# Patient Record
Sex: Female | Born: 1985 | Race: Asian | Hispanic: No | Marital: Single | Smoking: Never smoker
Health system: Southern US, Community
[De-identification: ages and names within clinical notes are randomized; demographics above are authoritative.]

## PROBLEM LIST (undated history)

## (undated) DIAGNOSIS — J45909 Unspecified asthma, uncomplicated: Secondary | ICD-10-CM

## (undated) DIAGNOSIS — O0930 Supervision of pregnancy with insufficient antenatal care, unspecified trimester: Secondary | ICD-10-CM

## (undated) DIAGNOSIS — Z789 Other specified health status: Secondary | ICD-10-CM

## (undated) HISTORY — PX: NO PAST SURGERIES: SHX2092

## (undated) HISTORY — DX: Unspecified asthma, uncomplicated: J45.909

## (undated) HISTORY — DX: Supervision of pregnancy with insufficient antenatal care, unspecified trimester: O09.30

---

## 2009-03-04 ENCOUNTER — Inpatient Hospital Stay (HOSPITAL_COMMUNITY): Admission: AD | Admit: 2009-03-04 | Discharge: 2009-03-06 | Payer: Self-pay | Admitting: Obstetrics

## 2010-08-10 NOTE — L&D Delivery Note (Signed)
Delivery Note At 5:55 AM a viable female was delivered via Vaginal, Spontaneous Delivery (Presentation:OA ; LOT ).  APGAR:9 9, ; weight 7#2 (3250g) .   Placenta status: delivered , .  Cord:  with the following complications: none.    Anesthesia:  epidural Episiotomy: none Lacerations: 2nd degree perineal, R sided labial Suture Repair: 3.0 vicryl rapide Est. Blood Loss (mL): 300  Mom to postpartum.  Baby to nursery-stable  O+, IUD, Bottle.  BOVARD,Renesha Lizama 05/08/2011, 6:11 AM

## 2010-10-28 ENCOUNTER — Inpatient Hospital Stay (HOSPITAL_COMMUNITY): Payer: Medicaid Other

## 2010-10-28 ENCOUNTER — Inpatient Hospital Stay (HOSPITAL_COMMUNITY)
Admission: AD | Admit: 2010-10-28 | Discharge: 2010-10-29 | Disposition: A | Discharge status: Home / Self Care | Payer: Medicaid Other | Source: Ambulatory Visit | Attending: Obstetrics & Gynecology | Admitting: Obstetrics & Gynecology

## 2010-10-28 DIAGNOSIS — O2390 Unspecified genitourinary tract infection in pregnancy, unspecified trimester: Secondary | ICD-10-CM | POA: Insufficient documentation

## 2010-10-28 DIAGNOSIS — R42 Dizziness and giddiness: Secondary | ICD-10-CM | POA: Insufficient documentation

## 2010-10-28 DIAGNOSIS — N390 Urinary tract infection, site not specified: Secondary | ICD-10-CM

## 2010-10-28 LAB — URINALYSIS, ROUTINE W REFLEX MICROSCOPIC
Bilirubin Urine: NEGATIVE
Glucose, UA: NEGATIVE mg/dL
Ketones, ur: 15 mg/dL — AB
pH: 6.5 (ref 5.0–8.0)

## 2010-10-28 LAB — CBC
HCT: 35 % — ABNORMAL LOW (ref 36.0–46.0)
Platelets: 201 10*3/uL (ref 150–400)
RDW: 13.2 % (ref 11.5–15.5)
WBC: 9.9 10*3/uL (ref 4.0–10.5)

## 2010-10-28 LAB — COMPREHENSIVE METABOLIC PANEL
ALT: 13 U/L (ref 0–35)
AST: 20 U/L (ref 0–37)
Albumin: 3.5 g/dL (ref 3.5–5.2)
Alkaline Phosphatase: 36 U/L — ABNORMAL LOW (ref 39–117)
GFR calc Af Amer: >60 mL/min (ref >60)
Glucose, Bld: 78 mg/dL (ref 70–99)
Potassium: 3.5 mEq/L (ref 3.5–5.1)
Sodium: 134 mEq/L — ABNORMAL LOW (ref 135–145)
Total Protein: 6.5 g/dL (ref 6.0–8.3)

## 2010-10-28 LAB — URINE MICROSCOPIC-ADD ON

## 2010-10-29 LAB — URINE CULTURE
Colony Count: NO GROWTH
Culture: NO GROWTH

## 2010-11-16 LAB — CBC
HCT: 29.7 % — ABNORMAL LOW (ref 36.0–46.0)
Hemoglobin: 12.7 g/dL (ref 12.0–15.0)
MCV: 94.2 fL (ref 78.0–100.0)
RBC: 3.16 MIL/uL — ABNORMAL LOW (ref 3.87–5.11)
RBC: 3.93 MIL/uL (ref 3.87–5.11)
WBC: 14.3 10*3/uL — ABNORMAL HIGH (ref 4.0–10.5)
WBC: 15.2 10*3/uL — ABNORMAL HIGH (ref 4.0–10.5)

## 2010-11-16 LAB — RPR: RPR Ser Ql: NONREACTIVE

## 2011-01-20 LAB — ABO/RH

## 2011-01-20 LAB — ANTIBODY SCREEN: Antibody Screen: NEGATIVE

## 2011-01-20 LAB — GC/CHLAMYDIA PROBE AMP, GENITAL: Gonorrhea: NEGATIVE

## 2011-01-20 LAB — HIV ANTIBODY (ROUTINE TESTING W REFLEX): HIV: NONREACTIVE

## 2011-04-07 LAB — STREP B DNA PROBE: GBS: NEGATIVE

## 2011-05-07 ENCOUNTER — Inpatient Hospital Stay (HOSPITAL_COMMUNITY)
Admission: AD | Admit: 2011-05-07 | Discharge: 2011-05-09 | DRG: 775 | Disposition: A | Discharge status: Home / Self Care | Payer: Medicaid Other | Source: Ambulatory Visit | Attending: Obstetrics and Gynecology | Admitting: Obstetrics and Gynecology

## 2011-05-07 DIAGNOSIS — Z3490 Encounter for supervision of normal pregnancy, unspecified, unspecified trimester: Secondary | ICD-10-CM

## 2011-05-07 HISTORY — DX: Other specified health status: Z78.9

## 2011-05-08 ENCOUNTER — Encounter (HOSPITAL_COMMUNITY): Payer: Self-pay | Admitting: *Deleted

## 2011-05-08 ENCOUNTER — Encounter (HOSPITAL_COMMUNITY): Payer: Self-pay | Admitting: Anesthesiology

## 2011-05-08 ENCOUNTER — Inpatient Hospital Stay (HOSPITAL_COMMUNITY): Payer: Medicaid Other | Admitting: Anesthesiology

## 2011-05-08 DIAGNOSIS — Z3490 Encounter for supervision of normal pregnancy, unspecified, unspecified trimester: Secondary | ICD-10-CM

## 2011-05-08 LAB — COMPREHENSIVE METABOLIC PANEL
Albumin: 2.7 g/dL — ABNORMAL LOW (ref 3.5–5.2)
Alkaline Phosphatase: 178 U/L — ABNORMAL HIGH (ref 39–117)
BUN: 13 mg/dL (ref 6–23)
Chloride: 104 mEq/L (ref 96–112)
Creatinine, Ser: 0.6 mg/dL (ref 0.50–1.10)
GFR calc Af Amer: >60 mL/min (ref >60)
Glucose, Bld: 92 mg/dL (ref 70–99)
Potassium: 3.6 mEq/L (ref 3.5–5.1)
Total Bilirubin: 0.2 mg/dL — ABNORMAL LOW (ref 0.3–1.2)
Total Protein: 6.2 g/dL (ref 6.0–8.3)

## 2011-05-08 LAB — CBC
HCT: 34 % — ABNORMAL LOW (ref 36.0–46.0)
Hemoglobin: 11.2 g/dL — ABNORMAL LOW (ref 12.0–15.0)
MCHC: 32.9 g/dL (ref 30.0–36.0)
MCV: 89.2 fL (ref 78.0–100.0)
RDW: 13.4 % (ref 11.5–15.5)

## 2011-05-08 LAB — ABO/RH: ABO/RH(D): O POS

## 2011-05-08 LAB — RPR: RPR Ser Ql: NONREACTIVE

## 2011-05-08 MED ORDER — DIBUCAINE 1 % RE OINT: 1.0000 "application " | TOPICAL_OINTMENT | RECTAL | Status: DC | PRN

## 2011-05-08 MED ORDER — PRENATAL PLUS 27-1 MG PO TABS: 1.0000 | ORAL_TABLET | Freq: Every day | ORAL | Status: DC

## 2011-05-08 MED ORDER — LIDOCAINE HCL 1.5 % IJ SOLN
INTRAMUSCULAR | Status: DC | PRN
  Administered 2011-05-08 (×2): 4 mL via EPIDURAL
  Administered 2011-05-08: 2 mL via EPIDURAL

## 2011-05-08 MED ORDER — IBUPROFEN 600 MG PO TABS
600.0000 mg | ORAL_TABLET | Freq: Four times a day (QID) | ORAL | Status: DC
  Administered 2011-05-08 – 2011-05-09 (×4): 600 mg via ORAL
  Filled 2011-05-08 (×3): qty 1

## 2011-05-08 MED ORDER — LIDOCAINE HCL (PF) 1 % IJ SOLN
30.0000 mL | INTRAMUSCULAR | Status: DC | PRN
  Administered 2011-05-08: 30 mL via SUBCUTANEOUS
  Filled 2011-05-08: qty 30

## 2011-05-08 MED ORDER — LACTATED RINGERS IV SOLN: INTRAVENOUS | Status: DC

## 2011-05-08 MED ORDER — ACETAMINOPHEN 325 MG PO TABS: 650.0000 mg | ORAL_TABLET | ORAL | Status: DC | PRN

## 2011-05-08 MED ORDER — SODIUM CHLORIDE 0.9 % IV SOLN: 250.0000 mL | INTRAVENOUS | Status: DC

## 2011-05-08 MED ORDER — DIPHENHYDRAMINE HCL 25 MG PO CAPS: 25.0000 mg | ORAL_CAPSULE | Freq: Four times a day (QID) | ORAL | Status: DC | PRN

## 2011-05-08 MED ORDER — ONDANSETRON HCL 4 MG/2ML IJ SOLN: 4.0000 mg | Freq: Four times a day (QID) | INTRAMUSCULAR | Status: DC | PRN

## 2011-05-08 MED ORDER — OXYTOCIN 20 UNITS IN LACTATED RINGERS INFUSION - SIMPLE: 1.0000 m[IU]/min | INTRAVENOUS | Status: DC

## 2011-05-08 MED ORDER — OXYCODONE-ACETAMINOPHEN 5-325 MG PO TABS
1.0000 | ORAL_TABLET | ORAL | Status: DC | PRN
  Administered 2011-05-09: 1 via ORAL
  Filled 2011-05-08: qty 1

## 2011-05-08 MED ORDER — DIPHENHYDRAMINE HCL 50 MG/ML IJ SOLN: 12.5000 mg | INTRAMUSCULAR | Status: DC | PRN

## 2011-05-08 MED ORDER — LANOLIN HYDROUS EX OINT: TOPICAL_OINTMENT | CUTANEOUS | Status: DC | PRN

## 2011-05-08 MED ORDER — TETANUS-DIPHTH-ACELL PERTUSSIS 5-2.5-18.5 LF-MCG/0.5 IM SUSP
0.5000 mL | Freq: Once | INTRAMUSCULAR | Status: AC
  Administered 2011-05-09: 0.5 mL via INTRAMUSCULAR
  Filled 2011-05-08: qty 0.5

## 2011-05-08 MED ORDER — CITRIC ACID-SODIUM CITRATE 334-500 MG/5ML PO SOLN: 30.0000 mL | ORAL | Status: DC | PRN

## 2011-05-08 MED ORDER — OXYCODONE-ACETAMINOPHEN 5-325 MG PO TABS: 2.0000 | ORAL_TABLET | ORAL | Status: DC | PRN

## 2011-05-08 MED ORDER — PHENYLEPHRINE 40 MCG/ML (10ML) SYRINGE FOR IV PUSH (FOR BLOOD PRESSURE SUPPORT)
80.0000 ug | PREFILLED_SYRINGE | INTRAVENOUS | Status: DC | PRN
  Filled 2011-05-08: qty 5

## 2011-05-08 MED ORDER — SIMETHICONE 80 MG PO CHEW: 80.0000 mg | CHEWABLE_TABLET | ORAL | Status: DC | PRN

## 2011-05-08 MED ORDER — SENNOSIDES-DOCUSATE SODIUM 8.6-50 MG PO TABS
2.0000 | ORAL_TABLET | Freq: Every day | ORAL | Status: DC
  Administered 2011-05-08: 2 via ORAL

## 2011-05-08 MED ORDER — TERBUTALINE SULFATE 1 MG/ML IJ SOLN: 0.2500 mg | Freq: Once | INTRAMUSCULAR | Status: DC | PRN

## 2011-05-08 MED ORDER — EPHEDRINE 5 MG/ML INJ
10.0000 mg | INTRAVENOUS | Status: DC | PRN
  Filled 2011-05-08: qty 4

## 2011-05-08 MED ORDER — OXYTOCIN BOLUS FROM INFUSION
500.0000 mL | Freq: Once | INTRAVENOUS | Status: DC
  Filled 2011-05-08: qty 500
  Filled 2011-05-08: qty 1000

## 2011-05-08 MED ORDER — BENZOCAINE-MENTHOL 20-0.5 % EX AERO
INHALATION_SPRAY | CUTANEOUS | Status: AC
  Administered 2011-05-08: 1 via TOPICAL
  Filled 2011-05-08: qty 56

## 2011-05-08 MED ORDER — SODIUM CHLORIDE 0.9 % IJ SOLN: 3.0000 mL | Freq: Two times a day (BID) | INTRAMUSCULAR | Status: DC

## 2011-05-08 MED ORDER — WITCH HAZEL-GLYCERIN EX PADS: 1.0000 "application " | MEDICATED_PAD | CUTANEOUS | Status: DC | PRN

## 2011-05-08 MED ORDER — ONDANSETRON HCL 4 MG/2ML IJ SOLN: 4.0000 mg | INTRAMUSCULAR | Status: DC | PRN

## 2011-05-08 MED ORDER — LACTATED RINGERS IV SOLN: 500.0000 mL | INTRAVENOUS | Status: DC | PRN

## 2011-05-08 MED ORDER — EPHEDRINE 5 MG/ML INJ
10.0000 mg | INTRAVENOUS | Status: DC | PRN
  Filled 2011-05-08 (×2): qty 4

## 2011-05-08 MED ORDER — OXYTOCIN 20 UNITS IN LACTATED RINGERS INFUSION - SIMPLE: 125.0000 mL/h | INTRAVENOUS | Status: DC | PRN

## 2011-05-08 MED ORDER — ZOLPIDEM TARTRATE 5 MG PO TABS: 5.0000 mg | ORAL_TABLET | Freq: Every evening | ORAL | Status: DC | PRN

## 2011-05-08 MED ORDER — ONDANSETRON HCL 4 MG PO TABS: 4.0000 mg | ORAL_TABLET | ORAL | Status: DC | PRN

## 2011-05-08 MED ORDER — FENTANYL 2.5 MCG/ML BUPIVACAINE 1/10 % EPIDURAL INFUSION (WH - ANES)
14.0000 mL/h | INTRAMUSCULAR | Status: DC
  Administered 2011-05-08: 12 mL/h via EPIDURAL
  Filled 2011-05-08: qty 60

## 2011-05-08 MED ORDER — LACTATED RINGERS IV SOLN: 500.0000 mL | Freq: Once | INTRAVENOUS | Status: DC

## 2011-05-08 MED ORDER — LACTATED RINGERS IV SOLN
INTRAVENOUS | Status: DC
  Administered 2011-05-08: 500 mL via INTRAVENOUS
  Administered 2011-05-08: 300 mL via INTRAVENOUS
  Administered 2011-05-08: 02:00:00 via INTRAVENOUS

## 2011-05-08 MED ORDER — BENZOCAINE-MENTHOL 20-0.5 % EX AERO
1.0000 "application " | INHALATION_SPRAY | CUTANEOUS | Status: DC | PRN
  Administered 2011-05-08: 1 via TOPICAL

## 2011-05-08 MED ORDER — PHENYLEPHRINE 40 MCG/ML (10ML) SYRINGE FOR IV PUSH (FOR BLOOD PRESSURE SUPPORT)
80.0000 ug | PREFILLED_SYRINGE | INTRAVENOUS | Status: DC | PRN
  Filled 2011-05-08 (×2): qty 5

## 2011-05-08 MED ORDER — SODIUM CHLORIDE 0.9 % IJ SOLN: 3.0000 mL | INTRAMUSCULAR | Status: DC | PRN

## 2011-05-08 MED ORDER — FLEET ENEMA 7-19 GM/118ML RE ENEM: 1.0000 | ENEMA | RECTAL | Status: DC | PRN

## 2011-05-08 MED ORDER — BUTORPHANOL TARTRATE 2 MG/ML IJ SOLN: 1.0000 mg | INTRAMUSCULAR | Status: DC | PRN

## 2011-05-08 MED ORDER — PRENATAL PLUS 27-1 MG PO TABS
1.0000 | ORAL_TABLET | Freq: Every day | ORAL | Status: DC
  Administered 2011-05-09: 1 via ORAL
  Filled 2011-05-08: qty 1

## 2011-05-08 MED ORDER — OXYTOCIN 20 UNITS IN LACTATED RINGERS INFUSION - SIMPLE
125.0000 mL/h | Freq: Once | INTRAVENOUS | Status: AC
  Administered 2011-05-08: 500 mL/h via INTRAVENOUS

## 2011-05-08 MED ORDER — IBUPROFEN 600 MG PO TABS
600.0000 mg | ORAL_TABLET | Freq: Four times a day (QID) | ORAL | Status: DC | PRN
  Administered 2011-05-08: 600 mg via ORAL
  Filled 2011-05-08 (×2): qty 1

## 2011-05-08 NOTE — Progress Notes (Signed)
UR Chart review completed.  

## 2011-05-08 NOTE — Anesthesia Preprocedure Evaluation (Signed)
Anesthesia Evaluation  Name, MR# and DOB Patient awake  General Assessment Comment  Reviewed: Allergy & Precautions, H&P , NPO status , Patient's Chart, lab work & pertinent test results, reviewed documented beta blocker date and time   History of Anesthesia Complications Negative for: history of anesthetic complications  Airway Mallampati: IV TM Distance: <3 FB Neck ROM: full  Mouth opening: Limited Mouth Opening Comment: Very limited mouth opening.  Cannot see past hard palate.  ANTICIPATED DIFFICULT INTUBATION Dental  (+) Teeth Intact   Pulmonary  clear to auscultation  breath sounds clear to auscultation none    Cardiovascular regular Normal    Neuro/Psych Negative Neurological ROS  Negative Psych ROS  GI/Hepatic/Renal negative GI ROS  negative Liver ROS  negative Renal ROS        Endo/Other  Negative Endocrine ROS (+)      Abdominal   Musculoskeletal   Hematology negative hematology ROS (+)   Peds  Reproductive/Obstetrics (+) Pregnancy    Anesthesia Other Findings             Anesthesia Physical Anesthesia Plan  ASA: II  Anesthesia Plan: Epidural   Post-op Pain Management:    Induction:   Airway Management Planned:   Additional Equipment:   Intra-op Plan:   Post-operative Plan:   Informed Consent: I have reviewed the patients History and Physical, chart, labs and discussed the procedure including the risks, benefits and alternatives for the proposed anesthesia with the patient or authorized representative who has indicated his/her understanding and acceptance.     Plan Discussed with:   Anesthesia Plan Comments:         Anesthesia Quick Evaluation

## 2011-05-08 NOTE — Progress Notes (Signed)
Contractions. Denies bleeding or ROM. G2P1

## 2011-05-08 NOTE — H&P (Signed)
Natalie Oconnor is a 25 y.o. female G2P1001 presenting for labor.  EDC 05/12/11 by 23 week [place], pregnancy complicated by late [facility]. Maternal Medical History:  Reason for admission: Reason for admission: contractions.  Contractions: Frequency: regular.      OB History    Grav Para Term Preterm Abortions TAB SAB Ect Mult Living   2 1 1       1     G1 39wk, SVD, 6#8, female G2 present  Past Medical History  Diagnosis Date  . No pertinent past medical history   . Normal pregnancy 05/08/2011   Past Surgical History  Procedure Date  . No past surgeries    Family History:denies Social History:  Denies alcohol, tob, drugs; married All NKDA  Review of Systems  Constitutional: Negative.   HENT: Negative.   Eyes: Negative.   Respiratory: Negative.   Cardiovascular: Negative.   Gastrointestinal: Negative.   Genitourinary: Negative.   Musculoskeletal: Negative.   Neurological: Negative.   Psychiatric/Behavioral: Negative.     Dilation: 10 Effacement (%): 100  Station: -1 Exam by:: a. white rn Blood pressure 128/76, pulse 73, temperature 97.8 F (36.6 C), resp. rate 18, height 5' (1.524 m), weight 71.215 kg (157 lb), SpO2 99.00%. Exam Physical Exam  Constitutional: She is oriented to person, place, and time. She appears well-developed and well-nourished.  HENT:  Head: Normocephalic and atraumatic.  Neck: Normal range of motion. Neck supple. No thyromegaly present.  Cardiovascular: Normal rate and regular rhythm.   Respiratory: Effort normal and breath sounds normal.  GI: Soft. Bowel sounds are normal. There is no tenderness.  Musculoskeletal: Normal range of motion.  Neurological: She is alert and oriented to person, place, and time.  Psychiatric: She has a normal mood and affect.    Prenatal labs: ABO, Rh: O/Positive/-- (06/12 0000) Antibody: Negative (06/12 0000) Rubella: Immune (06/12 0000) RPR: Nonreactive (07/03 0000)  HBsAg: Negative (06/12 0000)  HIV: Non-reactive (06/12  0000)  GBS:   neg Hgb 12.0, Pap WNL, Plt 211K, GC neg, Chl neg, glucola 96, late to care  U/S 26 nl limited anat, ant plac, female; f/u completes anat  Assessment/Plan: 24yo G2P1001 @ 39+ wk gbbs neg, expect SVD   Natalie Oconnor 05/08/2011, 4:13 AM

## 2011-05-08 NOTE — Anesthesia Procedure Notes (Signed)
Epidural Patient location during procedure: OB Start time: 05/08/2011 2:30 AM Reason for block: procedure for pain  Staffing Performed by: anesthesiologist   Preanesthetic Checklist Completed: patient identified, site marked, surgical consent, pre-op evaluation, timeout performed, IV checked, risks and benefits discussed and monitors and equipment checked  Epidural Patient position: sitting Prep: site prepped and draped and DuraPrep Patient monitoring: continuous pulse ox and blood pressure Approach: midline Injection technique: LOR air  Needle:  Needle type: Tuohy  Needle gauge: 17 G Needle length: 9 cm Needle insertion depth: 4 cm Catheter type: closed end flexible Catheter size: 19 Gauge Catheter at skin depth: 9 cm Test dose: negative  Assessment Events: blood not aspirated, injection not painful, no injection resistance, negative IV test and no paresthesia

## 2011-05-09 LAB — CBC
HCT: 30.1 % — ABNORMAL LOW (ref 36.0–46.0)
Hemoglobin: 9.9 g/dL — ABNORMAL LOW (ref 12.0–15.0)
MCHC: 32.9 g/dL (ref 30.0–36.0)
RBC: 3.36 MIL/uL — ABNORMAL LOW (ref 3.87–5.11)
WBC: 11.3 10*3/uL — ABNORMAL HIGH (ref 4.0–10.5)

## 2011-05-09 MED ORDER — OXYCODONE-ACETAMINOPHEN 5-325 MG PO TABS: 1.0000 | ORAL_TABLET | ORAL | Status: AC | PRN

## 2011-05-09 MED ORDER — IBUPROFEN 600 MG PO TABS: 600.0000 mg | ORAL_TABLET | Freq: Four times a day (QID) | ORAL | Status: AC

## 2011-05-09 NOTE — Discharge Summary (Signed)
Obstetric Discharge Summary Reason for Admission: onset of labor Prenatal Procedures: none Intrapartum Procedures: spontaneous vaginal delivery Postpartum Procedures: none Complications-Operative and Postpartum: 2nd degree perineal laceration and right labial Hemoglobin  Date Value Range Status  05/09/2011 9.9* 12.0-15.0 (g/dL) Final     HCT  Date Value Range Status  05/09/2011 30.1* 36.0-46.0 (%) Final    Discharge Diagnoses: Term Pregnancy-delivered at 39+ weeks  Discharge Information: Date: 05/09/2011 Activity: pelvic rest Diet: routine Medications: Ibuprofen and Percocet Condition: improved Instructions: refer to practice specific booklet Discharge to: home Follow-up Information    Follow up with BOVARD,JODY, MD. Make an appointment in 6 weeks.   Contact information:   [address] [facility] [address] [place] [address] [phone]          Newborn Data: Live born female  Birth Weight: 7 lb 2.6 oz (3249 g) APGAR: 9, 9  Home with mother.  Oliver Pila 05/09/2011, 9:34 AM

## 2011-05-09 NOTE — Anesthesia Postprocedure Evaluation (Signed)
Patient stable following vaginal delivery.  

## 2011-05-09 NOTE — Progress Notes (Signed)
Post Partum Day 1 Subjective: no complaints  Objective: Blood pressure 118/73, pulse 64, temperature 97.9 F (36.6 C), temperature source Oral, resp. rate 18, height 5' (1.524 m), weight 71.215 kg (157 lb), SpO2 99.00%, unknown if currently breastfeeding.  Physical Exam:  General: alert Lochia: appropriate Uterine Fundus: firm I  Basename 05/09/11 0505 05/08/11 0115  HGB 9.9* 11.2*  HCT 30.1* 34.0*    Assessment/Plan: Discharge home Motrin, percocet F/u 6 weeks   LOS: 2 days   Dinesh Ulysse W 05/09/2011, 9:30 AM

## 2011-11-29 ENCOUNTER — Ambulatory Visit: Payer: Managed Care, Other (non HMO) | Admitting: Family Medicine

## 2011-11-29 VITALS — BP 108/69 | HR 73 | Temp 98.0°F | Resp 16 | Ht 60.5 in | Wt 100.2 lb

## 2011-11-29 DIAGNOSIS — J069 Acute upper respiratory infection, unspecified: Secondary | ICD-10-CM

## 2011-11-29 MED ORDER — HYDROCODONE-HOMATROPINE 5-1.5 MG/5ML PO SYRP: 5.0000 mL | ORAL_SOLUTION | ORAL | Status: AC | PRN

## 2011-11-29 MED ORDER — FLUTICASONE PROPIONATE 50 MCG/ACT NA SUSP: 2.0000 | Freq: Every day | NASAL | Status: DC

## 2011-11-29 MED ORDER — AZELASTINE HCL 0.15 % NA SOLN: 2.0000 | Freq: Two times a day (BID) | NASAL | Status: DC

## 2011-11-29 NOTE — Progress Notes (Addendum)
Subjective: 26 year old lady who has a three-day history of runny nose, sore throat, ear congestion, cough. She has not been running a fever. She does not smoke. He does not have frequent recurrences of this.  Objective: Alert patient with some rest her respiratory discomfort. Her TMs are normal. Nose congested. Red around the nose from blowing it. Throat clear. Neck supple without significant nodes. Chest is clear to auscultation. Heart regular.  Assessment: URI  Plan: Treat symptomatically see orders thank you  Pharmacy called and had the wrong percentage of the astelazine. Therefore said they could change to 0.1%.

## 2011-11-29 NOTE — Patient Instructions (Signed)

## 2011-12-05 ENCOUNTER — Emergency Department (HOSPITAL_COMMUNITY)
Admission: EM | Admit: 2011-12-05 | Discharge: 2011-12-06 | Disposition: A | Discharge status: Home / Self Care | Payer: Managed Care, Other (non HMO) | Attending: Emergency Medicine | Admitting: Emergency Medicine

## 2011-12-05 ENCOUNTER — Encounter (HOSPITAL_COMMUNITY): Payer: Self-pay | Admitting: *Deleted

## 2011-12-05 DIAGNOSIS — R05 Cough: Secondary | ICD-10-CM | POA: Insufficient documentation

## 2011-12-05 DIAGNOSIS — J40 Bronchitis, not specified as acute or chronic: Secondary | ICD-10-CM | POA: Insufficient documentation

## 2011-12-05 DIAGNOSIS — R059 Cough, unspecified: Secondary | ICD-10-CM | POA: Insufficient documentation

## 2011-12-05 NOTE — ED Notes (Signed)
The pt has had a cough for one week. No temp

## 2011-12-05 NOTE — ED Notes (Addendum)
Patient complaining of a cough and runny nose for the past week; denies fever, shortness of breath and chest pain.  Patient states that she has not been around anyone that is sick.  Patient reports history of asthma, audible wheezing heard upon auscultation.  Patient actively coughing at this time.  Patient alert and oriented x4; PERRL present.  Will continue to monitor.

## 2011-12-06 ENCOUNTER — Emergency Department (HOSPITAL_COMMUNITY): Payer: Managed Care, Other (non HMO)

## 2011-12-06 MED ORDER — ALBUTEROL SULFATE HFA 108 (90 BASE) MCG/ACT IN AERS
2.0000 | INHALATION_SPRAY | RESPIRATORY_TRACT | Status: DC | PRN
  Administered 2011-12-06: 2 via RESPIRATORY_TRACT
  Filled 2011-12-06: qty 6.7

## 2011-12-06 MED ORDER — ALBUTEROL SULFATE (5 MG/ML) 0.5% IN NEBU
5.0000 mg | INHALATION_SOLUTION | Freq: Once | RESPIRATORY_TRACT | Status: AC
  Administered 2011-12-06: 5 mg via RESPIRATORY_TRACT
  Filled 2011-12-06: qty 1

## 2011-12-06 MED ORDER — HYDROCOD POLST-CHLORPHEN POLST 10-8 MG/5ML PO LQCR: 5.0000 mL | Freq: Two times a day (BID) | ORAL | Status: DC | PRN

## 2011-12-06 MED ORDER — IPRATROPIUM BROMIDE 0.02 % IN SOLN
0.5000 mg | Freq: Once | RESPIRATORY_TRACT | Status: AC
  Administered 2011-12-06: 0.5 mg via RESPIRATORY_TRACT
  Filled 2011-12-06: qty 2.5

## 2011-12-06 MED ORDER — HYDROCOD POLST-CHLORPHEN POLST 10-8 MG/5ML PO LQCR
5.0000 mL | Freq: Once | ORAL | Status: AC
  Administered 2011-12-06: 5 mL via ORAL
  Filled 2011-12-06: qty 5

## 2011-12-06 NOTE — Discharge Instructions (Signed)
Please read over the instructions below. Your chest xray shows no pneumonia. We are treating you for Bronchitis. Use the inhaler, (2 inhalations every 4 hours as needed for cough, shortness of breath and/or wheezing). Take the medication for cough as directed. Be aware the cough medicine can make you very drowsy. Do not drive for 6 hours after taking. Return for worsening symptoms. Otherwise follow up with a primary care doctor as needed. Referrals provided below as discussed.  Bronchitis Bronchitis is a problem of the air tubes leading to your lungs. This problem makes it hard for air to get in and out of the lungs. You may cough a lot because your air tubes are narrow. Going without care can cause lasting (chronic) bronchitis. HOME CARE   Drink enough fluids to keep your pee (urine) clear or pale yellow.   Use a cool mist humidifier.   Quit smoking if you smoke. If you keep smoking, the bronchitis might not get better.   Only take medicine as told by your doctor.  GET HELP RIGHT AWAY IF:   Coughing keeps you awake.   You start to wheeze.   You become more sick or weak.   You have a hard time breathing or get short of breath.   You cough up blood.   Coughing lasts more than 2 weeks.   You have a fever.   Your baby is older than 3 months with a rectal temperature of 102 F (38.9 C) or higher.   Your baby is 38 months old or younger with a rectal temperature of 100.4 F (38 C) or higher.  MAKE SURE YOU:  Understand these instructions.   Will watch your condition.   Will get help right away if you are not doing well or get worse.  Document Released: 01/13/2008 Document Revised: 07/16/2011 Document Reviewed: 06/28/2009 [facility] Patient Information 2012 [place], [place].  RESOURCE GUIDE  Dental Problems  Patients with Medicaid: [facility] [phone] [address]                                           [phone] [address] Phone:  [phone]                                                  Phone:  [phone]  If unable to pay or uninsured, contact:  [facility] or [facility]. to become qualified for the adult dental clinic.  Chronic Pain Problems Contact [facility]  [phone] Patients need to be referred by their primary care doctor.  Insufficient Money for Medicine Contact [employer]:  call "211" or [facility] 636-076-4357.  No Primary Care Doctor Call [facility]  [phone] Other agencies that provide inexpensive medical care    [facility]  [phone]    [facility]  [phone]    [facility]  [phone]    [facility]  [phone]    [facility]anned [facility]  [phone]    [facility]  [phone]  Psychological Services [facility]  [phone] [facility]  Services  [phone] [facility]   [phone] (emergency services [phone])  Substance Abuse Resources [facility]  7246715586 [facility] [phone] [facility] [place] [phone] Floydene Flock 7142817219 Residential & Outpatient Substance Abuse Program  [phone]  Abuse/Neglect [facility] Child Abuse Hotline (365)434-4657 [facility] Child Abuse Hotline [phone] (After Hours)  Emergency Shelter [facility] [phone]  Maternity Homes [facility] [place] [facility] [phone] [facility] [phone]  MRSA Hotline #:   4306063416    [facility] Resources  [facility]     [employer]                          [facility] Dept. [address] [place]                       [address]      [address]  Blondell Reveal Phone:  [phone]                                   Phone:  [phone]                  Phone:  [phone]  [facility] Mental [facility] Phone:  [phone]  [facility] Child Abuse Hotline (307)886-0775 9097287487 (After Hours)

## 2011-12-06 NOTE — ED Notes (Signed)
Patient back from x-ray; currently sitting up in bed.  No respiratory or acute distress noted.  Patient is not actively coughing at this time; patient denies any needs at this time.  Will continue to monitor.

## 2011-12-06 NOTE — ED Notes (Signed)
Katherine, FNP at bedside. 

## 2011-12-06 NOTE — ED Provider Notes (Signed)
History     CSN: [id number]  Arrival date & time 12/05/11  2328   First MD Initiated Contact with Patient 12/05/11 2354      Chief Complaint  Patient presents with  . Cough    (Consider location/radiation/quality/duration/timing/severity/associated sxs/prior treatment) Patient is a 26 y.o. female presenting with cough. The history is provided by the patient.  Cough This is a new problem. The current episode started more than 1 week ago. The problem occurs every few minutes. The problem has been gradually worsening. The cough is non-productive. There has been no fever. Associated symptoms include wheezing. Pertinent negatives include no chest pain, no chills, no rhinorrhea, no sore throat and no shortness of breath. She has tried nothing for the symptoms. She is not a smoker.  Pt reports approx 1 to 2 week hx of persistent dry hacking cough. Sx;s began as cold sx's that resolved but cough persisted. Pt noted coughing frequently during interview. Pt is not a smoker and has no hx of asthma.  Past Medical History  Diagnosis Date  . No pertinent past medical history   . Normal pregnancy 05/08/2011  . SVD (spontaneous vaginal delivery) 05/08/2011    Past Surgical History  Procedure Date  . No past surgeries     No family history on file.  History  Substance Use Topics  . Smoking status: Never Smoker   . Smokeless tobacco: Not on file  . Alcohol Use: No    OB History    Grav Para Term Preterm Abortions TAB SAB Ect Mult Living   3 2 2       2       Review of Systems  Constitutional: Negative for chills.  HENT: Negative for sore throat and rhinorrhea.   Respiratory: Positive for cough and wheezing. Negative for shortness of breath.   Cardiovascular: Negative for chest pain.  All other systems reviewed and are negative.    Allergies  Review of patient's allergies indicates no known allergies.  Home Medications   Current Outpatient Rx  Name Route Sig Dispense Refill    . CETIRIZINE HCL 10 MG PO TABS Oral Take 10 mg by mouth daily.    Marland Kitchen HYDROCODONE-HOMATROPINE 5-1.5 MG/5ML PO SYRP Oral Take 5 mLs by mouth every 4 (four) hours as needed for cough. 120 mL 0    BP 120/71  Pulse 104  Temp(Src) 98.5 F (36.9 C) (Oral)  Resp 18  SpO2 93%  Physical Exam  Constitutional: She is oriented to person, place, and time. She appears well-developed and well-nourished.  HENT:  Head: Normocephalic and atraumatic.  Eyes: Conjunctivae are normal.  Neck: Neck supple.  Cardiovascular: Normal rate and regular rhythm.   Pulmonary/Chest: Effort normal.       Scattered exp wheezes. frequent dry coughing  Musculoskeletal: Normal range of motion.  Neurological: She is alert and oriented to person, place, and time.  Skin: Skin is warm and dry.  Psychiatric: She has a normal mood and affect.    ED Course  Procedures   BBS CTA after neb. CXR w/o acute findings. Pt reports coughing has resolved for now. Will plan for d/c home w/ tx for bronchitis and PCP referrals. Pt agreeable w/ plan.   Labs Reviewed - No data to display Dg Chest 2 View  12/06/2011  *RADIOLOGY REPORT*  Clinical Data: Cough  CHEST - 2 VIEW  Comparison: None.  Findings: Sclerotic changes in the anterior right first rib.  Clear lungs.  Normal heart size.  No  pneumothorax and no pleural effusion.  No acute bony deformity.  IMPRESSION: No active cardiopulmonary disease.  Original Report Authenticated By: Donavan Burnet, M.D.     No diagnosis found.    MDM  HPI/PE and clinical findings c/w 1. Bronchitis (CXR normal, afebrile, cough and wheezing improved w/ neb and tussinex)        Leanne Chang, NP 12/06/11 [phone]

## 2011-12-06 NOTE — ED Provider Notes (Signed)
Medical screening examination/treatment/procedure(s) were performed by non-physician practitioner and as supervising physician I was immediately available for consultation/collaboration.  Olivia Mackie, MD 12/06/11 [phone]

## 2011-12-06 NOTE — ED Notes (Signed)
Patient given discharge paperwork; went over discharge instructions with patient.  Patient instructed to take prescriptions as directed, to follow up with her primary care physician if symptoms persist, and to return to the ED for new, worsening, or concerning symptoms.  Instructed not to drive while taking cough medication.

## 2012-10-25 ENCOUNTER — Encounter: Payer: Self-pay | Admitting: Family Medicine

## 2012-10-25 ENCOUNTER — Ambulatory Visit (INDEPENDENT_AMBULATORY_CARE_PROVIDER_SITE_OTHER): Payer: Managed Care, Other (non HMO) | Admitting: Family Medicine

## 2012-10-25 VITALS — BP 100/60 | HR 72 | Temp 98.3°F | Resp 12 | Ht 59.75 in | Wt 110.0 lb

## 2012-10-25 DIAGNOSIS — R1013 Epigastric pain: Secondary | ICD-10-CM | POA: Insufficient documentation

## 2012-10-25 DIAGNOSIS — J45909 Unspecified asthma, uncomplicated: Secondary | ICD-10-CM

## 2012-10-25 DIAGNOSIS — K3189 Other diseases of stomach and duodenum: Secondary | ICD-10-CM

## 2012-10-25 DIAGNOSIS — J4540 Moderate persistent asthma, uncomplicated: Secondary | ICD-10-CM | POA: Insufficient documentation

## 2012-10-25 NOTE — Patient Instructions (Addendum)
Pulmicort 2 puffs twice daily for 2 weeks then reduce to one puff twice daily Rinse mouth after each use.  Asthma Attack Prevention HOW CAN ASTHMA BE PREVENTED? Currently, there is no way to prevent asthma from starting. However, you can take steps to control the disease and prevent its symptoms after you have been diagnosed. Learn about your asthma and how to control it. Take an active role to control your asthma by working with your caregiver to create and follow an asthma action plan. An asthma action plan guides you in taking your medicines properly, avoiding factors that make your asthma worse, tracking your level of asthma control, responding to worsening asthma, and seeking emergency care when needed. To track your asthma, keep records of your symptoms, check your peak flow number using a peak flow meter (handheld device that shows how well air moves out of your lungs), and get regular asthma checkups.  Other ways to prevent asthma attacks include:  Use medicines as your caregiver directs.  Identify and avoid things that make your asthma worse (as much as you can).  Keep track of your asthma symptoms and level of control.  Get regular checkups for your asthma.  With your caregiver, write a detailed plan for taking medicines and managing an asthma attack. Then be sure to follow your action plan. Asthma is an ongoing condition that needs regular monitoring and treatment.  Identify and avoid asthma triggers. A number of outdoor allergens and irritants (pollen, mold, cold air, air pollution) can trigger asthma attacks. Find out what causes or makes your asthma worse, and take steps to avoid those triggers (see below).  Monitor your breathing. Learn to recognize warning signs of an attack, such as slight coughing, wheezing or shortness of breath. However, your lung function may already decrease before you notice any signs or symptoms, so regularly measure and record your peak airflow with a  home peak flow meter.  Identify and treat attacks early. If you act quickly, you're less likely to have a severe attack. You will also need less medicine to control your symptoms. When your peak flow measurements decrease and alert you to an upcoming attack, take your medicine as instructed, and immediately stop any activity that may have triggered the attack. If your symptoms do not improve, get medical help.  Pay attention to increasing quick-relief inhaler use. If you find yourself relying on your quick-relief inhaler (such as albuterol), your asthma is not under control. See your caregiver about adjusting your treatment. IDENTIFY AND CONTROL FACTORS THAT MAKE YOUR ASTHMA WORSE A number of common things can set off or make your asthma symptoms worse (asthma triggers). Keep track of your asthma symptoms for several weeks, detailing all the environmental and emotional factors that are linked with your asthma. When you have an asthma attack, go back to your asthma diary to see which factor, or combination of factors, might have contributed to it. Once you know what these factors are, you can take steps to control many of them.  Allergies: If you have allergies and asthma, it is important to take asthma prevention steps at home. Asthma attacks (worsening of asthma symptoms) can be triggered by allergies, which can cause temporary increased inflammation of your airways. Minimizing contact with the substance to which you are allergic will help prevent an asthma attack. Animal Dander:   Some people are allergic to the flakes of skin or dried saliva from animals with fur or feathers. Keep these pets out of your  home.  If you can't keep a pet outdoors, keep the pet out of your bedroom and other sleeping areas at all times, and keep the door closed.  Remove carpets and furniture covered with cloth from your home. If that is not possible, keep the pet away from fabric-covered furniture and carpets. Dust  Mites:  Many people with asthma are allergic to dust mites. Dust mites are tiny bugs that are found in every home, in mattresses, pillows, carpets, fabric-covered furniture, bedcovers, clothes, stuffed toys, fabric, and other fabric-covered items.  Cover your mattress in a special dust-proof cover.  Cover your pillow in a special dust-proof cover, or wash the pillow each week in hot water. Water must be hotter than 130 F to kill dust mites. Cold or warm water used with detergent and bleach can also be effective.  Wash the sheets and blankets on your bed each week in hot water.  Try not to sleep or lie on cloth-covered cushions.  Call ahead when traveling and ask for a smoke-free hotel room. Bring your own bedding and pillows, in case the hotel only supplies feather pillows and down comforters, which may contain dust mites and cause asthma symptoms.  Remove carpets from your bedroom and those laid on concrete, if you can.  Keep stuffed toys out of the bed, or wash the toys weekly in hot water or cooler water with detergent and bleach. Cockroaches:  Many people with asthma are allergic to the droppings and remains of cockroaches.  Keep food and garbage in closed containers. Never leave food out.  Use poison baits, traps, powders, gels, or paste (for example, boric acid).  If a spray is used to kill cockroaches, stay out of the room until the odor goes away. Indoor Mold:  Fix leaky faucets, pipes, or other sources of water that have mold around them.  Clean moldy surfaces with a cleaner that has bleach in it. Pollen and Outdoor Mold:  When pollen or mold spore counts are high, try to keep your windows closed.  Stay indoors with windows closed from late morning to afternoon, if you can. Pollen and some mold spore counts are highest at that time.  Ask your caregiver whether you need to take or increase anti-inflammatory medicine before your allergy season starts. Irritants:    Tobacco smoke is an irritant. If you smoke, ask your caregiver how you can quit. Ask family members to quit smoking, too. Do not allow smoking in your home or car.  If possible, do not use a wood-burning stove, kerosene heater, or fireplace. Minimize exposure to all sources of smoke, including incense, candles, fires, and fireworks.  Try to stay away from strong odors and sprays, such as perfume, talcum powder, hair spray, and paints.  Decrease humidity in your home and use an indoor air cleaning device. Reduce indoor humidity to below 60 percent. Dehumidifiers or central air conditioners can do this.  Try to have someone else vacuum for you once or twice a week, if you can. Stay out of rooms while they are being vacuumed and for a short while afterward.  If you vacuum, use a dust mask from a hardware store, a double-layered or microfilter vacuum cleaner bag, or a vacuum cleaner with a HEPA filter.  Sulfites in foods and beverages can be irritants. Do not drink beer or wine, or eat dried fruit, processed potatoes, or shrimp if they cause asthma symptoms.  Cold air can trigger an asthma attack. Cover your nose and mouth  with a scarf on cold or windy days.  Several health conditions can make asthma more difficult to manage, including runny nose, sinus infections, reflux disease, psychological stress, and sleep apnea. Your caregiver will treat these conditions, as well.  Avoid close contact with people who have a cold or the flu, since your asthma symptoms may get worse if you catch the infection from them. Wash your hands thoroughly after touching items that may have been handled by people with a respiratory infection.  Get a flu shot every year to protect against the flu virus, which often makes asthma worse for days or weeks. Also get a pneumonia shot once every five to 10 years. Drugs:  Aspirin and other painkillers can cause asthma attacks. 10% to 20% of people with asthma have  sensitivity to aspirin or a group of painkillers called non-steroidal anti-inflammatory drugs (NSAIDS), such as ibuprofen and naproxen. These drugs are used to treat pain and reduce fevers. Asthma attacks caused by any of these medicines can be severe and even fatal. These drugs must be avoided in people who have known aspirin sensitive asthma. Products with acetaminophen are considered safe for people who have asthma. It is important that people with aspirin sensitivity read labels of all over-the-counter drugs used to treat pain, colds, coughs, and fever.  Beta blockers and ACE inhibitors are other drugs which you should discuss with your caregiver, in relation to your asthma. ALLERGY SKIN TESTING  Ask your asthma caregiver about allergy skin testing or blood testing (RAST test) to identify the allergens to which you are sensitive. If you are found to have allergies, allergy shots (immunotherapy) for asthma may help prevent future allergies and asthma. With allergy shots, small doses of allergens (substances to which you are allergic) are injected under your skin on a regular schedule. Over a period of time, your body may become used to the allergen and less responsive with asthma symptoms. You can also take measures to minimize your exposure to those allergens. EXERCISE  If you have exercise-induced asthma, or are planning vigorous exercise, or exercise in cold, humid, or dry environments, prevent exercise-induced asthma by following your caregiver's advice regarding asthma treatment before exercising. Document Released: 07/15/2009 Document Revised: 10/19/2011 Document Reviewed: 07/15/2009 [facility] Patient Information 2013 [place], [place].

## 2012-10-25 NOTE — Progress Notes (Signed)
  Subjective:    Patient ID: Natalie Oconnor, female    DOB: 09/01/1985, 27 y.o.   MRN: [id number]  HPI  Patient is new to establish care. History of questionable asthma. She apparently has been to urgent care center and emergency department in the past for periodic treatment. Not clear if symptoms have ever been treated with any controller medications. She has frequent allergy symptoms treated with Zyrtec  Intermittent dyspepsia. No clear triggers.  Diffuse cramp like pain No associated:fever, appetite change, weight change, diarrhea,  No hx of lactose or gluten intolerance.  Patient is nonsmoker. No chronic medical problems other than above. Denies prior surgeries.  Patient lives with her boyfriend and her 2 children (boyfriend is their father). Moved here from [place] about 10 years ago. Works as a Advertising account planner  Past Medical History  Diagnosis Date  . No pertinent past medical history   . Normal pregnancy 05/08/2011  . SVD (spontaneous vaginal delivery) 05/08/2011  . Asthma    Past Surgical History  Procedure Laterality Date  . No past surgeries      reports that she has never smoked. She does not have any smokeless tobacco history on file. She reports that she does not drink alcohol. Her drug history is not on file. family history is not on file. No Known Allergies      Review of Systems  Constitutional: Negative for fever, chills and unexpected weight change.  HENT: Positive for congestion.   Respiratory: Positive for cough and wheezing. Negative for shortness of breath.   Cardiovascular: Negative for chest pain, palpitations and leg swelling.  Gastrointestinal: Negative for abdominal pain.  Neurological: Negative for dizziness and weakness.       Objective:   Physical Exam  Constitutional: She appears well-developed and well-nourished.  HENT:  Right Ear: External ear normal.  Left Ear: External ear normal.  Mouth/Throat: Oropharynx is clear and moist.   Neck: Neck supple. No thyromegaly present.  Cardiovascular: Normal rate and regular rhythm.   No murmur heard. Pulmonary/Chest:  Patient has a few faint wheezes. No rales. No retractions  Lymphadenopathy:    She has no cervical adenopathy.          Assessment & Plan:  Asthma. Probably at least moderate severity by history. Mild language barrier. Start Pulmicort 2 puffs twice daily for 2 weeks then try going to one puff twice daily. Rinse mouth after use. Instructions given. Reassess in one month.  Needs yearly flu vaccine.  Dyspepsia. No red flags such as vomiting, appetite or weight change, fever, localized pain, diarrhea.No hx of lactose intolerance.  She is encouraged to keep diary of symptoms and to see if any correlation with specific foods.

## 2012-11-15 ENCOUNTER — Other Ambulatory Visit (INDEPENDENT_AMBULATORY_CARE_PROVIDER_SITE_OTHER): Payer: Managed Care, Other (non HMO)

## 2012-11-15 DIAGNOSIS — Z0000 Encounter for general adult medical examination without abnormal findings: Secondary | ICD-10-CM

## 2012-11-15 LAB — CBC WITH DIFFERENTIAL/PLATELET
Basophils Absolute: 0 10*3/uL (ref 0.0–0.1)
HCT: 34.8 % — ABNORMAL LOW (ref 36.0–46.0)
Lymphs Abs: 2.2 10*3/uL (ref 0.7–4.0)
MCHC: 34.3 g/dL (ref 30.0–36.0)
MCV: 87.1 fl (ref 78.0–100.0)
Monocytes Absolute: 0.3 10*3/uL (ref 0.1–1.0)
Neutro Abs: 3.8 10*3/uL (ref 1.4–7.7)
Platelets: 178 10*3/uL (ref 150.0–400.0)
RDW: 13 % (ref 11.5–14.6)

## 2012-11-15 LAB — POCT URINALYSIS DIPSTICK
Ketones, UA: NEGATIVE
Protein, UA: NEGATIVE
Spec Grav, UA: 1.02

## 2012-11-15 LAB — LIPID PANEL
Cholesterol: 178 mg/dL (ref 0–200)
HDL: 44.3 mg/dL (ref >39.00)
LDL Cholesterol: 118 mg/dL — ABNORMAL HIGH (ref 0–99)
Triglycerides: 77 mg/dL (ref 0.0–149.0)
VLDL: 15.4 mg/dL (ref 0.0–40.0)

## 2012-11-15 LAB — HEPATIC FUNCTION PANEL
ALT: 19 U/L (ref 0–35)
Albumin: 4 g/dL (ref 3.5–5.2)
Total Bilirubin: 0.7 mg/dL (ref 0.3–1.2)

## 2012-11-15 LAB — BASIC METABOLIC PANEL
BUN: 15 mg/dL (ref 6–23)
CO2: 27 mEq/L (ref 19–32)
Chloride: 105 mEq/L (ref 96–112)
Glucose, Bld: 86 mg/dL (ref 70–99)
Potassium: 3.6 mEq/L (ref 3.5–5.1)

## 2012-11-15 LAB — TSH: TSH: 1.22 u[IU]/mL (ref 0.35–5.50)

## 2012-11-17 ENCOUNTER — Telehealth: Payer: Self-pay | Admitting: Family Medicine

## 2012-11-17 NOTE — Telephone Encounter (Signed)
No major abnormality.  Will discuss at CPE.

## 2012-11-17 NOTE — Telephone Encounter (Signed)
Pt has a CPE scheduled for next Tues. 4-15

## 2012-11-17 NOTE — Telephone Encounter (Signed)
Pt requesting a call with lab results from 4/8.

## 2012-11-17 NOTE — Telephone Encounter (Signed)
Pt informed on VM

## 2012-11-22 ENCOUNTER — Encounter: Payer: Self-pay | Admitting: Family Medicine

## 2012-11-22 ENCOUNTER — Ambulatory Visit (INDEPENDENT_AMBULATORY_CARE_PROVIDER_SITE_OTHER): Payer: Managed Care, Other (non HMO) | Admitting: Family Medicine

## 2012-11-22 ENCOUNTER — Other Ambulatory Visit (HOSPITAL_COMMUNITY)
Admission: RE | Admit: 2012-11-22 | Discharge: 2012-11-22 | Disposition: A | Discharge status: Home / Self Care | Payer: Managed Care, Other (non HMO) | Source: Ambulatory Visit | Attending: Family Medicine | Admitting: Family Medicine

## 2012-11-22 VITALS — BP 120/68 | HR 72 | Temp 99.0°F | Resp 12 | Ht 60.0 in | Wt 111.0 lb

## 2012-11-22 DIAGNOSIS — J309 Allergic rhinitis, unspecified: Secondary | ICD-10-CM

## 2012-11-22 DIAGNOSIS — Z124 Encounter for screening for malignant neoplasm of cervix: Secondary | ICD-10-CM | POA: Insufficient documentation

## 2012-11-22 DIAGNOSIS — Z0000 Encounter for general adult medical examination without abnormal findings: Secondary | ICD-10-CM

## 2012-11-22 MED ORDER — MONTELUKAST SODIUM 10 MG PO TABS: 10.0000 mg | ORAL_TABLET | Freq: Every day | ORAL | Status: DC

## 2012-11-22 NOTE — Patient Instructions (Addendum)
Consider Allegra (over the counter allergy medication) if symptoms persist Consider iron sulfate 325 mg one daily for low hemoglobin.

## 2012-11-22 NOTE — Progress Notes (Signed)
  Subjective:    Patient ID: Natalie Oconnor, female    DOB: 1986/01/31, 27 y.o.   MRN: [id number]  HPI Patient here for complete physical. She's not had a Pap smear in estimated 2 years. She has history of IUD She does not plan to go back to her previous gynecologist. She denies any prior history of abnormal Pap smears.  No chronic medical problems. She has a recent problems with itching in both eyes and nasal discharge which she attributes to seasonal allergies. Has tried over-the-counter Zyrtec without much relief.   Patient nonsmoker. No regular alcohol use.  Past Medical History  Diagnosis Date  . No pertinent past medical history   . Normal pregnancy 05/08/2011  . SVD (spontaneous vaginal delivery) 05/08/2011  . Asthma    Past Surgical History  Procedure Laterality Date  . No past surgeries      reports that she has never smoked. She does not have any smokeless tobacco history on file. She reports that she does not drink alcohol. Her drug history is not on file. family history is not on file. No Known Allergies    Review of Systems  Constitutional: Negative for fever, activity change, appetite change, fatigue and unexpected weight change.  HENT: Positive for congestion and postnasal drip. Negative for hearing loss, ear pain, sore throat and trouble swallowing.   Eyes: Positive for itching. Negative for pain and visual disturbance.  Respiratory: Negative for cough and shortness of breath.   Cardiovascular: Negative for chest pain and palpitations.  Gastrointestinal: Negative for abdominal pain, diarrhea, constipation and blood in stool.  Genitourinary: Negative for dysuria and hematuria.  Musculoskeletal: Negative for myalgias, back pain and arthralgias.  Skin: Negative for rash.  Neurological: Negative for dizziness, syncope and headaches.  Hematological: Negative for adenopathy.  Psychiatric/Behavioral: Negative for confusion and dysphoric mood.       Objective:   Physical Exam  Constitutional: She is oriented to person, place, and time. She appears well-developed and well-nourished.  HENT:  Head: Normocephalic and atraumatic.  Eyes: EOM are normal. Pupils are equal, round, and reactive to light.  Neck: Normal range of motion. Neck supple. No thyromegaly present.  Cardiovascular: Normal rate, regular rhythm and normal heart sounds.   No murmur heard. Pulmonary/Chest: Breath sounds normal. No respiratory distress. She has no wheezes. She has no rales.  Abdominal: Soft. Bowel sounds are normal. She exhibits no distension and no mass. There is no tenderness. There is no rebound and no guarding.  Genitourinary: Vagina normal.  Normal external genitalia.  IUD in place.  Minimal vaginal spotting from endocervix.  Cervix appears normal.  Bimanual uterus normal size.  No mass or adnexal tenderness.  Pap obtained with brush and cytobroom.  Breast exam-No mass.  Musculoskeletal: Normal range of motion. She exhibits no edema.  Lymphadenopathy:    She has no cervical adenopathy.  Neurological: She is alert and oriented to person, place, and time. She displays normal reflexes. No cranial nerve deficit.  Skin: No rash noted.  Psychiatric: She has a normal mood and affect. Her behavior is normal. Judgment and thought content normal.          Assessment & Plan:  Physical exam.  Tetanus up to date.  Labs reviewed with pt -minimally low hgb otherwise normal.  Pap obtained. She will consider other the counter iron supplement. Allergic conjunctivitis/rhinitis.  Try OTC Allegra and rx for Singulair 10 mg once daily.

## 2012-12-01 NOTE — Progress Notes (Signed)
Quick Note:  Pt informed ______ 

## 2013-04-25 ENCOUNTER — Other Ambulatory Visit: Payer: Self-pay | Admitting: Gastroenterology

## 2013-04-25 DIAGNOSIS — R109 Unspecified abdominal pain: Secondary | ICD-10-CM

## 2013-05-11 ENCOUNTER — Other Ambulatory Visit (HOSPITAL_COMMUNITY): Payer: Managed Care, Other (non HMO)

## 2013-05-16 ENCOUNTER — Encounter (HOSPITAL_COMMUNITY)
Admission: RE | Admit: 2013-05-16 | Discharge: 2013-05-16 | Disposition: A | Discharge status: Home / Self Care | Payer: Managed Care, Other (non HMO) | Source: Ambulatory Visit | Attending: Gastroenterology | Admitting: Gastroenterology

## 2013-05-16 ENCOUNTER — Ambulatory Visit (HOSPITAL_COMMUNITY)
Admission: RE | Admit: 2013-05-16 | Discharge: 2013-05-16 | Disposition: A | Discharge status: Home / Self Care | Payer: Managed Care, Other (non HMO) | Source: Ambulatory Visit | Attending: Gastroenterology | Admitting: Gastroenterology

## 2013-05-16 DIAGNOSIS — R109 Unspecified abdominal pain: Secondary | ICD-10-CM | POA: Insufficient documentation

## 2013-05-16 MED ORDER — SINCALIDE 5 MCG IJ SOLR
INTRAMUSCULAR | Status: AC
  Administered 2013-05-16: 1.01 ug via INTRAVENOUS
  Filled 2013-05-16: qty 5

## 2013-05-16 MED ORDER — TECHNETIUM TC 99M MEBROFENIN IV KIT
5.0000 | PACK | Freq: Once | INTRAVENOUS | Status: AC | PRN
Start: 2013-05-16 — End: 2013-05-16
  Administered 2013-05-16: 5 via INTRAVENOUS

## 2013-09-26 ENCOUNTER — Other Ambulatory Visit: Payer: Managed Care, Other (non HMO)

## 2013-10-03 ENCOUNTER — Encounter: Payer: Managed Care, Other (non HMO) | Admitting: Family Medicine

## 2014-03-12 LAB — OB RESULTS CONSOLE ABO/RH: RH Type: POSITIVE

## 2014-03-12 LAB — OB RESULTS CONSOLE ANTIBODY SCREEN: Antibody Screen: NEGATIVE

## 2014-03-12 LAB — OB RESULTS CONSOLE GC/CHLAMYDIA
CHLAMYDIA, DNA PROBE: NEGATIVE
GC PROBE AMP, GENITAL: NEGATIVE

## 2014-03-12 LAB — OB RESULTS CONSOLE HEPATITIS B SURFACE ANTIGEN: Hepatitis B Surface Ag: NEGATIVE

## 2014-03-12 LAB — OB RESULTS CONSOLE HIV ANTIBODY (ROUTINE TESTING): HIV: NONREACTIVE

## 2014-03-12 LAB — OB RESULTS CONSOLE RUBELLA ANTIBODY, IGM: RUBELLA: IMMUNE

## 2014-03-12 LAB — OB RESULTS CONSOLE RPR: RPR: NONREACTIVE

## 2014-04-26 LAB — OB RESULTS CONSOLE GBS: GBS: NEGATIVE

## 2014-05-22 ENCOUNTER — Encounter (HOSPITAL_COMMUNITY): Payer: Self-pay | Admitting: *Deleted

## 2014-05-22 ENCOUNTER — Telehealth (HOSPITAL_COMMUNITY): Payer: Self-pay | Admitting: *Deleted

## 2014-05-22 NOTE — Telephone Encounter (Signed)
Preadmission screen  

## 2014-05-26 ENCOUNTER — Encounter (HOSPITAL_COMMUNITY): Payer: Self-pay | Admitting: *Deleted

## 2014-05-26 ENCOUNTER — Inpatient Hospital Stay (HOSPITAL_COMMUNITY): Payer: Medicaid Other | Admitting: Anesthesiology

## 2014-05-26 ENCOUNTER — Inpatient Hospital Stay (HOSPITAL_COMMUNITY)
Admission: AD | Admit: 2014-05-26 | Discharge: 2014-05-27 | DRG: 775 | Disposition: A | Discharge status: Home / Self Care | Payer: Medicaid Other | Source: Ambulatory Visit | Attending: Obstetrics and Gynecology | Admitting: Obstetrics and Gynecology

## 2014-05-26 ENCOUNTER — Encounter (HOSPITAL_COMMUNITY): Payer: Medicaid Other | Admitting: Anesthesiology

## 2014-05-26 DIAGNOSIS — Z3A39 39 weeks gestation of pregnancy: Secondary | ICD-10-CM | POA: Diagnosis present

## 2014-05-26 DIAGNOSIS — IMO0001 Reserved for inherently not codable concepts without codable children: Secondary | ICD-10-CM

## 2014-05-26 DIAGNOSIS — O471 False labor at or after 37 completed weeks of gestation: Secondary | ICD-10-CM | POA: Diagnosis present

## 2014-05-26 LAB — TYPE AND SCREEN
ABO/RH(D): O POS
ANTIBODY SCREEN: NEGATIVE

## 2014-05-26 LAB — CBC
HEMATOCRIT: 35.3 % — AB (ref 36.0–46.0)
Hemoglobin: 11.9 g/dL — ABNORMAL LOW (ref 12.0–15.0)
MCH: 29.1 pg (ref 26.0–34.0)
MCHC: 33.7 g/dL (ref 30.0–36.0)
MCV: 86.3 fL (ref 78.0–100.0)
Platelets: 182 10*3/uL (ref 150–400)
RBC: 4.09 MIL/uL (ref 3.87–5.11)
RDW: 14.1 % (ref 11.5–15.5)
WBC: 13.3 10*3/uL — AB (ref 4.0–10.5)

## 2014-05-26 LAB — RPR

## 2014-05-26 MED ORDER — LACTATED RINGERS IV SOLN: 500.0000 mL | INTRAVENOUS | Status: DC | PRN

## 2014-05-26 MED ORDER — OXYCODONE-ACETAMINOPHEN 5-325 MG PO TABS: 2.0000 | ORAL_TABLET | ORAL | Status: DC | PRN

## 2014-05-26 MED ORDER — PHENYLEPHRINE 40 MCG/ML (10ML) SYRINGE FOR IV PUSH (FOR BLOOD PRESSURE SUPPORT)
80.0000 ug | PREFILLED_SYRINGE | INTRAVENOUS | Status: DC | PRN
  Filled 2014-05-26: qty 2

## 2014-05-26 MED ORDER — WITCH HAZEL-GLYCERIN EX PADS
1.0000 | MEDICATED_PAD | CUTANEOUS | Status: DC | PRN
Start: 2014-05-26 — End: 2014-05-27
  Administered 2014-05-26: 1 via TOPICAL

## 2014-05-26 MED ORDER — ONDANSETRON HCL 4 MG/2ML IJ SOLN: 4.0000 mg | INTRAMUSCULAR | Status: DC | PRN

## 2014-05-26 MED ORDER — MEASLES, MUMPS & RUBELLA VAC ~~LOC~~ INJ
0.5000 mL | INJECTION | Freq: Once | SUBCUTANEOUS | Status: DC
  Filled 2014-05-26: qty 0.5

## 2014-05-26 MED ORDER — LACTATED RINGERS IV SOLN
INTRAVENOUS | Status: DC
  Administered 2014-05-26: 12:00:00 via INTRAVENOUS

## 2014-05-26 MED ORDER — OXYCODONE-ACETAMINOPHEN 5-325 MG PO TABS: 1.0000 | ORAL_TABLET | ORAL | Status: DC | PRN

## 2014-05-26 MED ORDER — FLEET ENEMA 7-19 GM/118ML RE ENEM: 1.0000 | ENEMA | RECTAL | Status: DC | PRN

## 2014-05-26 MED ORDER — LANOLIN HYDROUS EX OINT
TOPICAL_OINTMENT | CUTANEOUS | Status: DC | PRN
Start: 2014-05-26 — End: 2014-05-27

## 2014-05-26 MED ORDER — LIDOCAINE HCL (PF) 1 % IJ SOLN
30.0000 mL | INTRAMUSCULAR | Status: AC | PRN
  Administered 2014-05-26 (×2): 5 mL via SUBCUTANEOUS
  Filled 2014-05-26: qty 30

## 2014-05-26 MED ORDER — TETANUS-DIPHTH-ACELL PERTUSSIS 5-2.5-18.5 LF-MCG/0.5 IM SUSP: 0.5000 mL | Freq: Once | INTRAMUSCULAR | Status: DC

## 2014-05-26 MED ORDER — EPHEDRINE 5 MG/ML INJ
10.0000 mg | INTRAVENOUS | Status: DC | PRN
  Filled 2014-05-26: qty 2

## 2014-05-26 MED ORDER — DIBUCAINE 1 % RE OINT
1.0000 "application " | TOPICAL_OINTMENT | RECTAL | Status: DC | PRN
  Administered 2014-05-26: 1 via RECTAL
  Filled 2014-05-26: qty 28

## 2014-05-26 MED ORDER — SENNOSIDES-DOCUSATE SODIUM 8.6-50 MG PO TABS
2.0000 | ORAL_TABLET | ORAL | Status: DC
  Administered 2014-05-26: 2 via ORAL
  Filled 2014-05-26: qty 2

## 2014-05-26 MED ORDER — IBUPROFEN 600 MG PO TABS
600.0000 mg | ORAL_TABLET | Freq: Four times a day (QID) | ORAL | Status: DC
  Administered 2014-05-26 – 2014-05-27 (×4): 600 mg via ORAL
  Filled 2014-05-26 (×4): qty 1

## 2014-05-26 MED ORDER — LACTATED RINGERS IV SOLN: INTRAVENOUS | Status: AC

## 2014-05-26 MED ORDER — SIMETHICONE 80 MG PO CHEW: 80.0000 mg | CHEWABLE_TABLET | ORAL | Status: DC | PRN

## 2014-05-26 MED ORDER — ONDANSETRON HCL 4 MG/2ML IJ SOLN: 4.0000 mg | Freq: Four times a day (QID) | INTRAMUSCULAR | Status: DC | PRN

## 2014-05-26 MED ORDER — PRENATAL MULTIVITAMIN CH: 1.0000 | ORAL_TABLET | Freq: Every day | ORAL | Status: DC

## 2014-05-26 MED ORDER — TERBUTALINE SULFATE 1 MG/ML IJ SOLN: 0.2500 mg | Freq: Once | INTRAMUSCULAR | Status: DC | PRN

## 2014-05-26 MED ORDER — FENTANYL 2.5 MCG/ML BUPIVACAINE 1/10 % EPIDURAL INFUSION (WH - ANES)
INTRAMUSCULAR | Status: AC
  Filled 2014-05-26: qty 125

## 2014-05-26 MED ORDER — CITRIC ACID-SODIUM CITRATE 334-500 MG/5ML PO SOLN: 30.0000 mL | ORAL | Status: DC | PRN

## 2014-05-26 MED ORDER — DIPHENHYDRAMINE HCL 50 MG/ML IJ SOLN: 12.5000 mg | INTRAMUSCULAR | Status: DC | PRN

## 2014-05-26 MED ORDER — PRENATAL MULTIVITAMIN CH
1.0000 | ORAL_TABLET | Freq: Every day | ORAL | Status: DC
  Administered 2014-05-27: 1 via ORAL
  Filled 2014-05-26: qty 1

## 2014-05-26 MED ORDER — BENZOCAINE-MENTHOL 20-0.5 % EX AERO
1.0000 | INHALATION_SPRAY | CUTANEOUS | Status: DC | PRN
Start: 2014-05-26 — End: 2014-05-27
  Administered 2014-05-26: 1 via TOPICAL
  Filled 2014-05-26: qty 56

## 2014-05-26 MED ORDER — OXYTOCIN 40 UNITS IN LACTATED RINGERS INFUSION - SIMPLE MED: 62.5000 mL/h | INTRAVENOUS | Status: DC

## 2014-05-26 MED ORDER — OXYTOCIN 40 UNITS IN LACTATED RINGERS INFUSION - SIMPLE MED: 62.5000 mL/h | INTRAVENOUS | Status: AC | PRN

## 2014-05-26 MED ORDER — FENTANYL 2.5 MCG/ML BUPIVACAINE 1/10 % EPIDURAL INFUSION (WH - ANES)
14.0000 mL/h | INTRAMUSCULAR | Status: DC | PRN
  Administered 2014-05-26: 14 mL/h via EPIDURAL

## 2014-05-26 MED ORDER — ACETAMINOPHEN 325 MG PO TABS: 650.0000 mg | ORAL_TABLET | ORAL | Status: DC | PRN

## 2014-05-26 MED ORDER — OXYTOCIN 40 UNITS IN LACTATED RINGERS INFUSION - SIMPLE MED
1.0000 m[IU]/min | INTRAVENOUS | Status: DC
  Administered 2014-05-26: 1 m[IU]/min via INTRAVENOUS
  Filled 2014-05-26: qty 1000

## 2014-05-26 MED ORDER — PHENYLEPHRINE 40 MCG/ML (10ML) SYRINGE FOR IV PUSH (FOR BLOOD PRESSURE SUPPORT)
PREFILLED_SYRINGE | INTRAVENOUS | Status: AC
  Filled 2014-05-26: qty 10

## 2014-05-26 MED ORDER — DIPHENHYDRAMINE HCL 25 MG PO CAPS: 25.0000 mg | ORAL_CAPSULE | Freq: Four times a day (QID) | ORAL | Status: DC | PRN

## 2014-05-26 MED ORDER — ZOLPIDEM TARTRATE 5 MG PO TABS: 5.0000 mg | ORAL_TABLET | Freq: Every evening | ORAL | Status: DC | PRN

## 2014-05-26 MED ORDER — OXYTOCIN BOLUS FROM INFUSION
500.0000 mL | INTRAVENOUS | Status: DC
  Administered 2014-05-26: 500 mL via INTRAVENOUS

## 2014-05-26 MED ORDER — LACTATED RINGERS IV SOLN
500.0000 mL | Freq: Once | INTRAVENOUS | Status: AC
  Administered 2014-05-26: 500 mL via INTRAVENOUS

## 2014-05-26 MED ORDER — ONDANSETRON HCL 4 MG PO TABS
4.0000 mg | ORAL_TABLET | ORAL | Status: DC | PRN
Start: 2014-05-26 — End: 2014-05-27

## 2014-05-26 NOTE — Anesthesia Preprocedure Evaluation (Signed)
Anesthesia Evaluation  Patient identified by MRN, date of birth, ID band Patient awake    Reviewed: Allergy & Precautions, H&P , NPO status , Patient's Chart, lab work & pertinent test results  History of Anesthesia Complications Negative for: history of anesthetic complications  Airway Mallampati: I TM Distance: >3 FB Neck ROM: Full    Dental  (+) Teeth Intact   Pulmonary neg pulmonary ROS,          Cardiovascular negative cardio ROS  Rhythm:Regular     Neuro/Psych negative neurological ROS  negative psych ROS   GI/Hepatic negative GI ROS, Neg liver ROS,   Endo/Other  negative endocrine ROS  Renal/GU negative Renal ROS     Musculoskeletal   Abdominal   Peds  Hematology  (+) anemia ,   Anesthesia Other Findings   Reproductive/Obstetrics (+) Pregnancy                           Anesthesia Physical Anesthesia Plan  ASA: II  Anesthesia Plan: Epidural   Post-op Pain Management:    Induction:   Airway Management Planned:   Additional Equipment: None  Intra-op Plan:   Post-operative Plan:   Informed Consent: I have reviewed the patients History and Physical, chart, labs and discussed the procedure including the risks, benefits and alternatives for the proposed anesthesia with the patient or authorized representative who has indicated his/her understanding and acceptance.   Dental advisory given  Plan Discussed with: Anesthesiologist  Anesthesia Plan Comments:         Anesthesia Quick Evaluation

## 2014-05-26 NOTE — Progress Notes (Signed)
Patient ID: Natalie Oconnor, female   DOB: 04/24/1986, 28 y.o.   MRN: [id number] The ptr has an epidural and is comfortable. The cervis is 5 cm 80% effaced and the vertex is at -1/-2 station. We will start pitocin

## 2014-05-26 NOTE — Progress Notes (Signed)
Patient ID: Natalie Oconnor, female   DOB: 02/28/1986, 28 y.o.   MRN: [id number] Contractions have spaced out since admission. Cervix 5 cm 90% effacesd and the vertex is at - 2 station. Membranes ruptured during exam with clear fluid. If contractions do not improve will start pitocin.

## 2014-05-26 NOTE — H&P (Signed)
[name]:  Natalie Oconnor, Natalie Oconnor                  ACCOUNT NO.:  [id number]  MEDICAL RECORD NO.:  [id number]  LOCATION:  [id number]                          [facility]ACILITY:  [facility]  PHYSICIAN:  Malachi Prohomas F. Ambrose MantleHenley, M.D. DATE OF BIRTH:  Dec 15, 1985  DATE OF ADMISSION:  05/26/2014 DATE OF DISCHARGE:                             HISTORY & PHYSICAL   PRESENT ILLNESS:  This is a 28 year old, [place] female, para 2-0-2 gravida 3, EDC May 29, 2014, admitted in active labor according to the maternity admission unit.  Blood group and type O positive with negative antibody, RPR negative, urine culture negative.  Hepatitis B surface antigen negative, HIV negative, GC and Chlamydia negative. Rubella immune.  Pap test normal.  Genetic screenings were normal.  One- hour Glucola was 165, 3-hour GTT was normal.  Repeat HIV and RPR negative.  Group B strep was negative.  This patient began her prenatal course at another practice, apparently did not like and transferred to our care at 26 weeks with Dr. Senaida Oresichardson.  A 17-week anatomy ultrasound agreed with the last period and the due date. The ultrasound showed normal anatomy.  The patient wanted to be surprised with the gender of the baby.  At 28 weeks and 2 days, the fundal height was 26 cm. Ultrasound was scheduled for growth.  Ultrasound on March 22, 2014, showed an estimated fetal weight of 34 percentile.  AFI of 13.  Normal anatomy.  The patient weighed 133 when she had her first visit with our practice at 26 weeks.  Her last weight in the office was 152.  She was scheduled for induction next week if labor did not ensue.  On May 24, 2014, her cervix was 3 cm, 50% vertex, at -2.  She began contracting, came to the emergency room, the maternity admission unit nurse thought she was 5 cm, 90% but still at a -3 station.  PAST MEDICAL HISTORY:  Revealed a history of asthma.  No other significant illnesses.  SURGICAL HISTORY:  She denies surgical history.  ALLERGIES:  No  known drug allergies.  No latex or food allergies.  SOCIAL HISTORY:  Never smoked.  Occasional alcohol prior to pregnancy. Denied illicit drugs.  Born in [place].  Works as a Advertising account plannernail technician. Occasional exercise.  Married.  FAMILY HISTORY:  No current problems or disability she is aware of.  PHYSICAL EXAMINATION:  VITAL SIGNS:  On admission, temperature 97.9, pulse 69, respirations 18, blood pressure 113/69. HEART:  Normal size and sounds.  No murmurs. LUNGS:  Clear to auscultation. PELVIC:  Fundal height at her last prenatal visit on October 15 was 37 cm.  Fetal heart tones were normal.  Cervix according to the NICU nurse 5 cm, 90%, vertex at a -3 station.  ADMITTING IMPRESSION:  Intrauterine pregnancy at 39 weeks and 4 days, active labor.  The patient is admitted.  She has been held in maternity admission waiting for a bed to be available, but now apparently she is on her way.     Malachi Prohomas F. Ambrose MantleHenley, M.D.     TFH/MEDQ  D:  05/26/2014  T:  05/26/2014  Job:  [id number]

## 2014-05-26 NOTE — Anesthesia Procedure Notes (Signed)
Epidural Patient location during procedure: OB Start time: 05/26/2014 12:47 PM End time: 05/26/2014 1:01 PM  Staffing Anesthesiologist: Jillayne Witte, CHRIS Performed by: anesthesiologist   Preanesthetic Checklist Completed: patient identified, surgical consent, pre-op evaluation, timeout performed, IV checked, risks and benefits discussed and monitors and equipment checked  Epidural Patient position: sitting Prep: site prepped and draped and DuraPrep Patient monitoring: heart rate, cardiac monitor, continuous pulse ox and blood pressure Approach: midline Location: L3-L4 Injection technique: LOR saline  Needle:  Needle type: Tuohy  Needle gauge: 17 G Needle length: 9 cm Needle insertion depth: 7 cm Catheter type: closed end flexible Catheter size: 19 Gauge Catheter at skin depth: 12 cm Test dose: Other  Assessment Events: blood not aspirated, injection not painful, no injection resistance, negative IV test and no paresthesia  Additional Notes H+P and labs checked, risks and benefits discussed with the patient, consent obtained, procedure tolerated well and without complications.  Reason for block:procedure for pain

## 2014-05-26 NOTE — MAU Note (Signed)
Patient presents with complaint of contractoins every 5-10 minutes since 0700. Denies LOF or bleeding. Fetus active.

## 2014-05-26 NOTE — Progress Notes (Signed)
Patient ID: Natalie Oconnor, female   DOB: 01/26/1986, 28 y.o.   MRN: [id number] Delivery note:  The pt reached full dilatation at 3:32 PM and pushed well. She delivered spontaneously LOA over a second degree left of ML laceration a living female infant with Apgars of 9 and 9 at 1 and 5 minutes. The placenta was intact and the irregular laceration was repaired with 3-0 vicryl. The uterus was normal. EBL 300 cc's.

## 2014-05-27 LAB — CBC
HEMATOCRIT: 31.6 % — AB (ref 36.0–46.0)
Hemoglobin: 10.4 g/dL — ABNORMAL LOW (ref 12.0–15.0)
MCH: 28.6 pg (ref 26.0–34.0)
MCHC: 32.9 g/dL (ref 30.0–36.0)
MCV: 86.8 fL (ref 78.0–100.0)
PLATELETS: 158 10*3/uL (ref 150–400)
RBC: 3.64 MIL/uL — ABNORMAL LOW (ref 3.87–5.11)
RDW: 14.4 % (ref 11.5–15.5)
WBC: 14.6 10*3/uL — AB (ref 4.0–10.5)

## 2014-05-27 MED ORDER — IBUPROFEN 600 MG PO TABS: 600.0000 mg | ORAL_TABLET | Freq: Four times a day (QID) | ORAL | Status: DC | PRN

## 2014-05-27 NOTE — Plan of Care (Signed)
Problem: Discharge Progression Outcomes Goal: Barriers To Progression Addressed/Resolved Outcome: Not Applicable Date Met:  71/16/57 Patient desires discharge after 24 hours. Condition stable. MD ordered discharge

## 2014-05-27 NOTE — Progress Notes (Signed)
Patient ID: Natalie Oconnor, female   DOB: 03/07/1986, 28 y.o.   MRN: [id number] #1 afebrile Labs normal VS normal Wants d/c. Advised may go after the baby is 24 hours old.

## 2014-05-27 NOTE — Anesthesia Postprocedure Evaluation (Signed)
Anesthesia Post Note  Patient: Natalie Oconnor  Procedure(s) Performed: * No procedures listed *  Anesthesia type: Epidural  Patient location: Mother/Baby  Post pain: Pain level controlled  Post assessment: Post-op Vital signs reviewed  Last Vitals:  Filed Vitals:   05/27/14 0530  BP: 99/46  Pulse: 56  Temp: 36.9 C  Resp: 18    Post vital signs: Reviewed  Level of consciousness:alert  Complications: No apparent anesthesia complications

## 2014-05-27 NOTE — Plan of Care (Signed)
Problem: Phase II Progression Outcomes Goal: Incision intact & without signs/symptoms of infection Outcome: Not Applicable Date Met:  60/67/70 Vaginal delivery; no incison

## 2014-05-27 NOTE — Discharge Summary (Signed)
NAMRosette Reveal:  Oconnor, Natalie                  ACCOUNT NO.:  [id number]  MEDICAL RECORD NO.:  [id number]  LOCATION:  9114                          FACILITY:  [facility]  PHYSICIAN:  Malachi Prohomas F. Ambrose MantleHenley, M.D. DATE OF BIRTH:  1986/01/24  DATE OF ADMISSION:  05/26/2014 DATE OF DISCHARGE:  05/27/2014                              DISCHARGE SUMMARY   A 28 year old, [place] female, para 2-0-0-2, gravida 3, [facility] 2014 admitted in active labor.  Prenatal labs were normal.  She transferred to our office at 26 weeks.  After transfer, 2 ultrasounds were done because the baby was thought to be a little small, but both ultrasound suggested the baby had normal growth.  After admission to the hospital, she was thought to be 5 cm.  I examined her and she was 5 cm and membranes ruptured during the exam with clear fluid.  The patient received an epidural.  She failed to make any more progress.  Pitocin was started.  She reached full dilatation at 3:32 p.m. and pushed well. She delivered spontaneously, LOA over a second-degree left of midline laceration by Dr. Ambrose MantleHenley.  A living female infant with Apgars of 9 and 9 at 1 and 5 minutes.  Placenta was intact.  Irregular laceration repaired with 3-0 Vicryl.  Blood loss was about 300 mL.  Postpartum, the patient did very well and requested discharge prior to 24 hours.  She was advised that the baby cannot be discharged until it is 324 hours old. Her initial hemoglobin is 11.9, hematocrit 35.3, while count 13,300. RPR was nonreactive.  Followup hemoglobin 10.4.  FINAL DIAGNOSIS:  Intrauterine pregnancy at 39+ weeks, delivered LOA.  OPERATION:  Spontaneous delivery LOA, repair of second-degree laceration.  FINAL CONDITION:  Improved.  INSTRUCTIONS:  Include our regular discharge instruction booklet as well as the after visit summary.  Prescription for Motrin 600 mg 30 tablets, 1 every 6 hours as needed for pain.  She is advised to return to the office in 6 weeks  for followup examination and she is also advised that if she desires birth control at the 6 week's checkup, she will need to call ahead of time, so she can be checked.     Malachi Prohomas F. Ambrose MantleHenley, M.D.     TFH/MEDQ  D:  05/27/2014  T:  05/27/2014  Job:  [id number]

## 2014-05-27 NOTE — Discharge Instructions (Signed)
booklet °

## 2014-05-28 NOTE — Progress Notes (Signed)
Post discharge chart review completed.  

## 2014-05-30 ENCOUNTER — Inpatient Hospital Stay (HOSPITAL_COMMUNITY): Admission: RE | Admit: 2014-05-30 | Payer: Managed Care, Other (non HMO) | Source: Ambulatory Visit

## 2014-06-11 ENCOUNTER — Encounter (HOSPITAL_COMMUNITY): Payer: Self-pay | Admitting: *Deleted

## 2014-08-10 NOTE — L&D Delivery Note (Signed)
Delivery Note Pt pushed great and at 2:02 PM a healthy female was delivered via  (Presentation: OA).  APGAR:8 ,9 ; weight  pending.   Placenta status: delivered spontaneously .  Cord:  with the following complications: nuchal x 1 delivered through.  Anesthesia:  epiduraL Episiotomy:  NONE Lacerations:  First degree perineal Suture Repair: 3.0 vicryl rapide Est. Blood Loss (mL):   Mom to postpartum.  Baby to Couplet care / Skin to Skin. Pt declines circumcision D/w pt ppBTL in detail and she desires to proceed.  D/w her the risks of bleeding infection and possible damage to bowel and bladder.  Also d/w pt risk of failure and unintended pregnancy of 1/100.  Pt desires to proceed.  OR will not let me schedule for 03/30/15, will just have to be NPO after midnight and hope we can get in to get it done tomorrow. Oliver Pila 03/29/2015, 2:31 PM

## 2014-10-25 LAB — OB RESULTS CONSOLE ABO/RH: RH Type: POSITIVE

## 2014-10-25 LAB — OB RESULTS CONSOLE ANTIBODY SCREEN: ANTIBODY SCREEN: NEGATIVE

## 2014-10-25 LAB — OB RESULTS CONSOLE HIV ANTIBODY (ROUTINE TESTING): HIV: NONREACTIVE

## 2014-10-25 LAB — OB RESULTS CONSOLE RUBELLA ANTIBODY, IGM: Rubella: IMMUNE

## 2014-10-25 LAB — OB RESULTS CONSOLE HEPATITIS B SURFACE ANTIGEN: Hepatitis B Surface Ag: NEGATIVE

## 2014-10-25 LAB — OB RESULTS CONSOLE RPR: RPR: NONREACTIVE

## 2015-02-07 ENCOUNTER — Inpatient Hospital Stay (HOSPITAL_COMMUNITY)
Admission: AD | Admit: 2015-02-07 | Payer: Medicaid Other | Source: Ambulatory Visit | Admitting: Obstetrics and Gynecology

## 2015-03-13 LAB — OB RESULTS CONSOLE GBS: STREP GROUP B AG: NEGATIVE

## 2015-03-25 ENCOUNTER — Encounter (HOSPITAL_COMMUNITY): Payer: Self-pay | Admitting: *Deleted

## 2015-03-25 ENCOUNTER — Telehealth (HOSPITAL_COMMUNITY): Payer: Self-pay | Admitting: *Deleted

## 2015-03-25 NOTE — Telephone Encounter (Signed)
Preadmission screen  

## 2015-03-28 NOTE — H&P (Signed)
Drina Jobst is a 29 y.o. female [phone] at 39+ w ans eeks (EDD 04/03/15 by 16 week [place])  Presents for IOL at term.  Prenatal care complicated by late start at 15 weeks and a short interpregnacy interval with delivery less than 12 months ago.  Pt had abnormal GCT but normal 3 hour GTT.  She would like permanent sterilization and signed papers for a pp BTL in 6/16.  Maternal Medical History:  Contractions: Frequency: irregular.   Perceived severity is mild.    Prenatal Complications - Diabetes: none.    OB History    Gravida Para Term Preterm AB TAB SAB Ectopic Multiple Living   2010 NSVD 6#8oz 2012 NSVD 7#2oz 2015 NSVD 7#1oz  Past Medical History  Diagnosis Date  . No pertinent past medical history   . Normal pregnancy 05/08/2011  . SVD (spontaneous vaginal delivery) 05/08/2011  . Asthma   . Late prenatal care    Past Surgical History  Procedure Laterality Date  . No past surgeries     Family History: family history is not on file. Social History:  reports that she has never smoked. She has never used smokeless tobacco. She reports that she does not drink alcohol or use illicit drugs.   Prenatal Transfer Tool  Maternal Diabetes: No Genetic Screening: Normal Maternal Ultrasounds/Referrals: Normal Fetal Ultrasounds or other Referrals:  None Maternal Substance Abuse:  No Significant Maternal Medications:  None Significant Maternal Lab Results:  None Other Comments:  None  ROS    unknown if currently breastfeeding. Maternal Exam:  Uterine Assessment: Contraction strength is mild.  Contraction frequency is irregular.   Abdomen: Patient reports no abdominal tenderness. Fetal presentation: vertex  Introitus: Normal vulva. Normal vagina.    Physical Exam  Constitutional: She appears well-developed and well-nourished.  Cardiovascular: Normal rate and regular rhythm.   Respiratory: Effort normal.  GI: Soft.  Genitourinary: Vagina normal.  Neurological:  She is alert.  Psychiatric: She has a normal mood and affect.    Prenatal labs: ABO, Rh: O/Positive/-- (03/17 0000) Antibody: Negative (03/17 0000) Rubella: Immune (03/17 0000) RPR: Nonreactive (03/17 0000)  HBsAg: Negative (03/17 0000)  HIV: Non-reactive (03/17 0000)  GBS: Negative (08/03 0000)  TETRA screen WNL One hour GCT 181 Three hour GTT 71/155/148/132 Assessment/Plan: Pt for IOL at term and plan AROM and pitocin.  Pt plans epidural and ppBTL as OR allows   Ryland Tungate W 03/28/2015, 8:28 PM

## 2015-03-29 ENCOUNTER — Inpatient Hospital Stay (HOSPITAL_COMMUNITY): Payer: Medicaid Other | Admitting: Anesthesiology

## 2015-03-29 ENCOUNTER — Encounter (HOSPITAL_COMMUNITY): Payer: Self-pay

## 2015-03-29 ENCOUNTER — Inpatient Hospital Stay (HOSPITAL_COMMUNITY)
Admission: RE | Admit: 2015-03-29 | Discharge: 2015-03-30 | DRG: 775 | Disposition: A | Discharge status: Home / Self Care | Payer: Medicaid Other | Source: Ambulatory Visit | Attending: Obstetrics and Gynecology | Admitting: Obstetrics and Gynecology

## 2015-03-29 DIAGNOSIS — O9952 Diseases of the respiratory system complicating childbirth: Secondary | ICD-10-CM | POA: Diagnosis present

## 2015-03-29 DIAGNOSIS — Z3A39 39 weeks gestation of pregnancy: Secondary | ICD-10-CM | POA: Diagnosis present

## 2015-03-29 LAB — CBC
HEMATOCRIT: 34.9 % — AB (ref 36.0–46.0)
Hemoglobin: 11.5 g/dL — ABNORMAL LOW (ref 12.0–15.0)
MCH: 28.1 pg (ref 26.0–34.0)
MCHC: 33 g/dL (ref 30.0–36.0)
MCV: 85.3 fL (ref 78.0–100.0)
Platelets: 191 10*3/uL (ref 150–400)
RBC: 4.09 MIL/uL (ref 3.87–5.11)
RDW: 14.5 % (ref 11.5–15.5)
WBC: 8.5 10*3/uL (ref 4.0–10.5)

## 2015-03-29 LAB — TYPE AND SCREEN
ABO/RH(D): O POS
Antibody Screen: NEGATIVE

## 2015-03-29 MED ORDER — OXYCODONE-ACETAMINOPHEN 5-325 MG PO TABS
1.0000 | ORAL_TABLET | ORAL | Status: DC | PRN
  Administered 2015-03-29: 1 via ORAL
  Filled 2015-03-29: qty 1

## 2015-03-29 MED ORDER — OXYTOCIN BOLUS FROM INFUSION: 500.0000 mL | INTRAVENOUS | Status: DC

## 2015-03-29 MED ORDER — PRENATAL MULTIVITAMIN CH
1.0000 | ORAL_TABLET | Freq: Every day | ORAL | Status: DC
  Administered 2015-03-30: 1 via ORAL
  Filled 2015-03-29: qty 1

## 2015-03-29 MED ORDER — ZOLPIDEM TARTRATE 5 MG PO TABS: 5.0000 mg | ORAL_TABLET | Freq: Every evening | ORAL | Status: DC | PRN

## 2015-03-29 MED ORDER — LIDOCAINE HCL (PF) 1 % IJ SOLN
INTRAMUSCULAR | Status: DC | PRN
  Administered 2015-03-29: 4 mL
  Administered 2015-03-29: 6 mL via EPIDURAL

## 2015-03-29 MED ORDER — ONDANSETRON HCL 4 MG/2ML IJ SOLN: 4.0000 mg | INTRAMUSCULAR | Status: DC | PRN

## 2015-03-29 MED ORDER — TERBUTALINE SULFATE 1 MG/ML IJ SOLN
0.2500 mg | Freq: Once | INTRAMUSCULAR | Status: DC | PRN
  Filled 2015-03-29: qty 1

## 2015-03-29 MED ORDER — LIDOCAINE HCL (PF) 1 % IJ SOLN
30.0000 mL | INTRAMUSCULAR | Status: DC | PRN
  Filled 2015-03-29: qty 30

## 2015-03-29 MED ORDER — DIBUCAINE 1 % RE OINT: 1.0000 "application " | TOPICAL_OINTMENT | RECTAL | Status: DC | PRN

## 2015-03-29 MED ORDER — DIPHENHYDRAMINE HCL 50 MG/ML IJ SOLN: 12.5000 mg | INTRAMUSCULAR | Status: DC | PRN

## 2015-03-29 MED ORDER — LACTATED RINGERS IV SOLN
INTRAVENOUS | Status: DC
  Administered 2015-03-29 (×2): via INTRAVENOUS

## 2015-03-29 MED ORDER — LACTATED RINGERS IV SOLN
500.0000 mL | INTRAVENOUS | Status: DC | PRN
  Administered 2015-03-29: 1000 mL via INTRAVENOUS

## 2015-03-29 MED ORDER — OXYTOCIN 40 UNITS IN LACTATED RINGERS INFUSION - SIMPLE MED
1.0000 m[IU]/min | INTRAVENOUS | Status: DC
  Administered 2015-03-29: 2 m[IU]/min via INTRAVENOUS
  Filled 2015-03-29: qty 1000

## 2015-03-29 MED ORDER — LANOLIN HYDROUS EX OINT: TOPICAL_OINTMENT | CUTANEOUS | Status: DC | PRN

## 2015-03-29 MED ORDER — WITCH HAZEL-GLYCERIN EX PADS: 1.0000 "application " | MEDICATED_PAD | CUTANEOUS | Status: DC | PRN

## 2015-03-29 MED ORDER — IBUPROFEN 600 MG PO TABS
600.0000 mg | ORAL_TABLET | Freq: Four times a day (QID) | ORAL | Status: DC
  Administered 2015-03-29 – 2015-03-30 (×5): 600 mg via ORAL
  Filled 2015-03-29 (×6): qty 1

## 2015-03-29 MED ORDER — FENTANYL 2.5 MCG/ML BUPIVACAINE 1/10 % EPIDURAL INFUSION (WH - ANES): 14.0000 mL/h | INTRAMUSCULAR | Status: DC | PRN

## 2015-03-29 MED ORDER — DIPHENHYDRAMINE HCL 25 MG PO CAPS: 25.0000 mg | ORAL_CAPSULE | Freq: Four times a day (QID) | ORAL | Status: DC | PRN

## 2015-03-29 MED ORDER — OXYTOCIN 40 UNITS IN LACTATED RINGERS INFUSION - SIMPLE MED
62.5000 mL/h | INTRAVENOUS | Status: DC
  Administered 2015-03-29: 62.5 mL/h via INTRAVENOUS

## 2015-03-29 MED ORDER — OXYCODONE-ACETAMINOPHEN 5-325 MG PO TABS: 2.0000 | ORAL_TABLET | ORAL | Status: DC | PRN

## 2015-03-29 MED ORDER — OXYCODONE-ACETAMINOPHEN 5-325 MG PO TABS: 1.0000 | ORAL_TABLET | ORAL | Status: DC | PRN

## 2015-03-29 MED ORDER — SIMETHICONE 80 MG PO CHEW: 80.0000 mg | CHEWABLE_TABLET | ORAL | Status: DC | PRN

## 2015-03-29 MED ORDER — SENNOSIDES-DOCUSATE SODIUM 8.6-50 MG PO TABS
2.0000 | ORAL_TABLET | ORAL | Status: DC
  Administered 2015-03-30: 2 via ORAL
  Filled 2015-03-29: qty 2

## 2015-03-29 MED ORDER — ACETAMINOPHEN 325 MG PO TABS: 650.0000 mg | ORAL_TABLET | ORAL | Status: DC | PRN

## 2015-03-29 MED ORDER — EPHEDRINE 5 MG/ML INJ
10.0000 mg | INTRAVENOUS | Status: DC | PRN
  Filled 2015-03-29: qty 2

## 2015-03-29 MED ORDER — ONDANSETRON HCL 4 MG PO TABS: 4.0000 mg | ORAL_TABLET | ORAL | Status: DC | PRN

## 2015-03-29 MED ORDER — PHENYLEPHRINE 40 MCG/ML (10ML) SYRINGE FOR IV PUSH (FOR BLOOD PRESSURE SUPPORT)
80.0000 ug | PREFILLED_SYRINGE | INTRAVENOUS | Status: DC | PRN
  Filled 2015-03-29: qty 2
  Filled 2015-03-29: qty 20

## 2015-03-29 MED ORDER — FENTANYL 2.5 MCG/ML BUPIVACAINE 1/10 % EPIDURAL INFUSION (WH - ANES)
14.0000 mL/h | INTRAMUSCULAR | Status: DC | PRN
  Administered 2015-03-29: 12 mL/h via EPIDURAL
  Filled 2015-03-29: qty 125

## 2015-03-29 MED ORDER — BENZOCAINE-MENTHOL 20-0.5 % EX AERO
1.0000 "application " | INHALATION_SPRAY | CUTANEOUS | Status: DC | PRN
  Administered 2015-03-29: 1 via TOPICAL
  Filled 2015-03-29: qty 56

## 2015-03-29 MED ORDER — BUTORPHANOL TARTRATE 1 MG/ML IJ SOLN: 1.0000 mg | INTRAMUSCULAR | Status: DC | PRN

## 2015-03-29 MED ORDER — CITRIC ACID-SODIUM CITRATE 334-500 MG/5ML PO SOLN: 30.0000 mL | ORAL | Status: DC | PRN

## 2015-03-29 MED ORDER — ONDANSETRON HCL 4 MG/2ML IJ SOLN: 4.0000 mg | Freq: Four times a day (QID) | INTRAMUSCULAR | Status: DC | PRN

## 2015-03-29 MED ORDER — LACTATED RINGERS IV SOLN: INTRAVENOUS | Status: DC

## 2015-03-29 MED ORDER — TETANUS-DIPHTH-ACELL PERTUSSIS 5-2.5-18.5 LF-MCG/0.5 IM SUSP: 0.5000 mL | Freq: Once | INTRAMUSCULAR | Status: DC

## 2015-03-29 NOTE — Progress Notes (Signed)
Patient ID: Natalie Oconnor, female   DOB: 1986-06-09, 29 y.o.   MRN: [id number] Pt feeling mild ctx only afeb VSS FHR category 1 Cervix 50/3/-1 anterior AROM clear Desires pp BTL.

## 2015-03-29 NOTE — Anesthesia Preprocedure Evaluation (Addendum)
Anesthesia Evaluation  Patient identified by MRN, date of birth, ID band Patient awake    Reviewed: Allergy & Precautions, H&P , Patient's Chart, lab work & pertinent test results  Airway Mallampati: II  TM Distance: >3 FB Neck ROM: full    Dental  (+) Teeth Intact   Pulmonary asthma ,  breath sounds clear to auscultation        Cardiovascular Rhythm:regular Rate:Normal     Neuro/Psych    GI/Hepatic   Endo/Other    Renal/GU      Musculoskeletal   Abdominal   Peds  Hematology   Anesthesia Other Findings  No inhaler; no steroids     Reproductive/Obstetrics (+) Pregnancy                            Anesthesia Physical Anesthesia Plan  ASA: II  Anesthesia Plan: Epidural   Post-op Pain Management:    Induction:   Airway Management Planned:   Additional Equipment:   Intra-op Plan:   Post-operative Plan:   Informed Consent: I have reviewed the patients History and Physical, chart, labs and discussed the procedure including the risks, benefits and alternatives for the proposed anesthesia with the patient or authorized representative who has indicated his/her understanding and acceptance.   Dental Advisory Given  Plan Discussed with:   Anesthesia Plan Comments: (Labs checked- platelets confirmed with RN in room. Fetal heart tracing, per RN, reported to be stable enough for sitting procedure. Discussed epidural, and patient consents to the procedure:  included risk of possible headache,backache, failed block, allergic reaction, and nerve injury. This patient was asked if she had any questions or concerns before the procedure started.)        Anesthesia Quick Evaluation

## 2015-03-29 NOTE — Anesthesia Procedure Notes (Signed)

## 2015-03-30 LAB — CBC
HEMATOCRIT: 28.8 % — AB (ref 36.0–46.0)
HEMOGLOBIN: 9.4 g/dL — AB (ref 12.0–15.0)
MCH: 28.1 pg (ref 26.0–34.0)
MCHC: 32.6 g/dL (ref 30.0–36.0)
MCV: 86 fL (ref 78.0–100.0)
Platelets: 160 10*3/uL (ref 150–400)
RBC: 3.35 MIL/uL — ABNORMAL LOW (ref 3.87–5.11)
RDW: 14.8 % (ref 11.5–15.5)
WBC: 9.8 10*3/uL (ref 4.0–10.5)

## 2015-03-30 LAB — RPR: RPR Ser Ql: NONREACTIVE

## 2015-03-30 LAB — SURGICAL PCR SCREEN
MRSA, PCR: INVALID — AB
STAPHYLOCOCCUS AUREUS: INVALID — AB

## 2015-03-30 MED ORDER — PNEUMOCOCCAL VAC POLYVALENT 25 MCG/0.5ML IJ INJ
0.5000 mL | INJECTION | INTRAMUSCULAR | Status: AC
  Administered 2015-03-30: 0.5 mL via INTRAMUSCULAR
  Filled 2015-03-30: qty 0.5

## 2015-03-30 MED ORDER — METOCLOPRAMIDE HCL 10 MG PO TABS: 10.0000 mg | ORAL_TABLET | Freq: Once | ORAL | Status: DC

## 2015-03-30 MED ORDER — OXYCODONE-ACETAMINOPHEN 5-325 MG PO TABS: 1.0000 | ORAL_TABLET | ORAL | Status: DC | PRN

## 2015-03-30 MED ORDER — IBUPROFEN 600 MG PO TABS: 600.0000 mg | ORAL_TABLET | Freq: Four times a day (QID) | ORAL | Status: DC

## 2015-03-30 MED ORDER — FAMOTIDINE 20 MG PO TABS: 40.0000 mg | ORAL_TABLET | Freq: Once | ORAL | Status: DC

## 2015-03-30 MED ORDER — LACTATED RINGERS IV SOLN: INTRAVENOUS | Status: DC

## 2015-03-30 NOTE — Discharge Summary (Signed)
Obstetric Discharge Summary Reason for Admission: induction of labor Prenatal Procedures: none Intrapartum Procedures: spontaneous vaginal delivery Postpartum Procedures: none Complications-Operative and Postpartum: first degree perineal laceration HEMOGLOBIN  Date Value Ref Range Status  03/30/2015 9.4* 12.0 - 15.0 g/dL Final   HCT  Date Value Ref Range Status  03/30/2015 28.8* 36.0 - 46.0 % Final    Physical Exam:  General: alert and cooperative Lochia: appropriate Uterine Fundus: firm   Discharge Diagnoses: Term Pregnancy-delivered  Discharge Information: Date: 03/30/2015 Activity: pelvic rest Diet: routine Medications: Ibuprofen and Percocet Condition: improved Instructions: refer to practice specific booklet Discharge to: home Follow-up Information    Follow up with Natalie Pila, MD In 6 weeks.   Specialty:  Obstetrics and Gynecology   Why:  postpartum and nexplanon (need to come sign form for nexplanon BEFORE visit)   Contact information:   [address] [place] [address] [phone]       Newborn Data: Live born female  Birth Weight: 7 lb 0.9 oz (3201 g) APGAR: 9, 9  Home with mother.  Natalie Oconnor 03/30/2015, 10:24 AM

## 2015-03-30 NOTE — Progress Notes (Signed)
Post Partum Day 1 Subjective: no complaints  NPO for pp tubal.  D/Oconnor pt again the tubal ligation in detail.  She does not think she wants to do a surgery but would rather just try a nexplanon In the office.  Will cancel procedure.  Objective: Blood pressure 116/62, pulse 65, temperature 98 F (36.7 C), temperature source Oral, resp. rate 18, height 5' (1.524 m), weight 69.854 kg (154 lb), SpO2 99 %, unknown if currently breastfeeding.  Physical Exam:  General: alert and cooperative Lochia: appropriate Uterine Fundus: firm   Recent Labs  03/29/15 0810 03/30/15 0553  HGB 11.5* 9.4*  HCT 34.9* 28.8*    Assessment/Plan: Plan for discharge tomorrow  Cancel BTL   LOS: 1 day   Natalie Oconnor 03/30/2015, 10:21 AM

## 2015-03-30 NOTE — Progress Notes (Signed)
Patient ID: Natalie Oconnor, female   DOB: 29-Jun-1986, 29 y.o.   MRN: [id number] Pt has decided to go home, d/c with motrin percocet

## 2015-03-30 NOTE — Anesthesia Postprocedure Evaluation (Signed)
Anesthesia Post Note  Patient: Natalie Oconnor  Procedure(s) Performed: * No procedures listed *  Anesthesia type: Epidural  Patient location: Mother/Baby  Post pain: Pain level controlled  Post assessment: Post-op Vital signs reviewed  Last Vitals:  Filed Vitals:   03/30/15 0620  BP: 116/62  Pulse: 65  Temp: 36.7 C  Resp: 18    Post vital signs: Reviewed  Level of consciousness:alert  Complications: No apparent anesthesia complications

## 2018-04-25 ENCOUNTER — Encounter: Payer: Self-pay | Admitting: Internal Medicine

## 2018-04-25 ENCOUNTER — Ambulatory Visit (INDEPENDENT_AMBULATORY_CARE_PROVIDER_SITE_OTHER): Payer: Self-pay | Admitting: Internal Medicine

## 2018-04-25 ENCOUNTER — Other Ambulatory Visit (HOSPITAL_COMMUNITY)
Admission: RE | Admit: 2018-04-25 | Discharge: 2018-04-25 | Disposition: A | Discharge status: Home / Self Care | Payer: Self-pay | Source: Ambulatory Visit | Attending: Internal Medicine | Admitting: Internal Medicine

## 2018-04-25 VITALS — BP 140/80 | HR 68 | Ht 60.0 in | Wt 116.0 lb

## 2018-04-25 DIAGNOSIS — Z124 Encounter for screening for malignant neoplasm of cervix: Secondary | ICD-10-CM | POA: Insufficient documentation

## 2018-04-25 DIAGNOSIS — R0789 Other chest pain: Secondary | ICD-10-CM

## 2018-04-25 DIAGNOSIS — Z13220 Encounter for screening for lipoid disorders: Secondary | ICD-10-CM

## 2018-04-25 DIAGNOSIS — Z8709 Personal history of other diseases of the respiratory system: Secondary | ICD-10-CM

## 2018-04-25 DIAGNOSIS — Z0000 Encounter for general adult medical examination without abnormal findings: Secondary | ICD-10-CM

## 2018-04-25 DIAGNOSIS — N898 Other specified noninflammatory disorders of vagina: Secondary | ICD-10-CM

## 2018-04-25 DIAGNOSIS — Z1329 Encounter for screening for other suspected endocrine disorder: Secondary | ICD-10-CM

## 2018-04-25 DIAGNOSIS — Z9882 Breast implant status: Secondary | ICD-10-CM

## 2018-04-25 DIAGNOSIS — R8290 Unspecified abnormal findings in urine: Secondary | ICD-10-CM

## 2018-04-25 LAB — POCT URINALYSIS DIPSTICK
Appearance: NEGATIVE
Bilirubin, UA: NEGATIVE
Glucose, UA: NEGATIVE
Ketones, UA: NEGATIVE
Nitrite, UA: NEGATIVE
Odor: NEGATIVE
Protein, UA: NEGATIVE
Spec Grav, UA: 1.01 (ref 1.010–1.025)
Urobilinogen, UA: 0.2 E.U./dL
pH, UA: 6.5 (ref 5.0–8.0)

## 2018-04-25 LAB — POCT WET PREP (WET MOUNT)

## 2018-04-25 MED ORDER — METRONIDAZOLE 500 MG PO TABS: 500.0000 mg | ORAL_TABLET | Freq: Three times a day (TID) | ORAL | 0 refills | Status: DC

## 2018-04-25 NOTE — Patient Instructions (Addendum)
It was a pleasure to see you today.  Return in 1 year or as needed.Flagyl 500 mg bid x 7 days for vaginitis. Do not drink alcohol while taking Flagyl.Labs will be sent to you via mail.  STD screening done on Pap smear.

## 2018-04-25 NOTE — Progress Notes (Signed)
Subjective:    Patient ID: Natalie Oconnor, female    DOB: 12-27-1985, 32 y.o.   MRN: [id number]  HPI First visit for this 32 year old [place] Female who presents to the office for the first time today.  She is here for complete physical examination.  Saw Dr. Caryl Never 2014 for physical examination.  Apparently has remote history of asthma treated with Pulmicort  on one occasion and allergic rhinitis.  Old records indicate she had a negative chest x-ray taken because of a cough in 2013.  Past medical history and Social history: She resides with boyfriend.  She has 4 children ages 97,7,4, and 3.  No history of illnesses requiring hospitalization.  No accidents.    No known drug allergies.  No chronic medications.  Parents live in [place].  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  Non-smoker.  Occasionally consumes alcohol.  Family history: Father age 71 in good health.  Mother age 69 in good health.  2 brothers and 2 sisters in good health.  Old records indicate she used to have an IUD.  Currently not on birth control.  Menstrual period was last week.  History of bilateral breast implants done here in [place].   Review of Systems  Constitutional: Negative.   Musculoskeletal:       Complaint of left lateral chest pain adjacent to left breast  All other systems reviewed and are negative.  complaining of some pain left lateral breast area.     Objective:   Physical Exam  Constitutional: She is oriented to person, place, and time. She appears well-developed and well-nourished. No distress.  HENT:  Head: Normocephalic and atraumatic.  Right Ear: External ear normal.  Left Ear: External ear normal.  Mouth/Throat: Oropharynx is clear and moist.  Eyes: Pupils are equal, round, and reactive to light. Conjunctivae and EOM are normal. Right eye exhibits no discharge. Left eye exhibits no discharge. No  scleral icterus.  Neck: Neck supple. No JVD present. No thyromegaly present.  Cardiovascular: Normal rate, regular rhythm and normal heart sounds.  No murmur heard. Pulmonary/Chest: Effort normal and breath sounds normal. No stridor. No respiratory distress. She has no wheezes. She has no rales.  Breast exam: Bilateral implants.  No deformity.  Has some left lateral chest wall pain adjacent to the implant  Abdominal: Soft. Bowel sounds are normal. She exhibits no distension and no mass. There is no tenderness. There is no rebound and no guarding. No hernia.  Genitourinary:  Genitourinary Comments: Thick white vaginal discharge.  Wet prep shows white blood cells.  Musculoskeletal: She exhibits no edema.  Lymphadenopathy:    She has no cervical adenopathy.  Neurological: She is alert and oriented to person, place, and time. She displays normal reflexes. No cranial nerve deficit or sensory deficit. She exhibits normal muscle tone. Coordination normal.  Skin: Skin is warm and dry. She is not diaphoretic.  Psychiatric: She has a normal mood and affect. Her behavior is normal. Judgment and thought content normal.  Vitals reviewed.         Assessment & Plan:  Vaginal discharge- STD testing done on Pap.  Treat with Flagyl 500 mg twice daily for 7 days.  Fasting labs drawn and pending.  Tetanus immunization up-to-date-records in epic indicate she had Tdap 03/08/2014 and also indicates she had pneumococcal 23 in August 2016 likely due to diagnosis of asthma.  Left lateral chest wall pain treat with over-the-counter ibuprofen and reassured.  History of bilateral breast implants  Plan: Return in 1 year or as needed.  Labs pending.

## 2018-04-26 LAB — URINE CULTURE
MICRO NUMBER:: 91107538
RESULT: NO GROWTH
SPECIMEN QUALITY: ADEQUATE

## 2018-04-26 LAB — COMPLETE METABOLIC PANEL WITH GFR
AG RATIO: 2 (calc) (ref 1.0–2.5)
ALKALINE PHOSPHATASE (APISO): 34 U/L (ref 33–115)
ALT: 13 U/L (ref 6–29)
AST: 21 U/L (ref 10–30)
Albumin: 4.5 g/dL (ref 3.6–5.1)
BUN: 13 mg/dL (ref 7–25)
CHLORIDE: 109 mmol/L (ref 98–110)
CO2: 28 mmol/L (ref 20–32)
Calcium: 9.1 mg/dL (ref 8.6–10.2)
Creat: 0.57 mg/dL (ref 0.50–1.10)
GFR, Est African American: 142 mL/min/{1.73_m2} (ref >=60)
GFR, Est Non African American: 123 mL/min/{1.73_m2} (ref >=60)
Globulin: 2.2 g/dL (calc) (ref 1.9–3.7)
Glucose, Bld: 79 mg/dL (ref 65–99)
POTASSIUM: 4.1 mmol/L (ref 3.5–5.3)
SODIUM: 142 mmol/L (ref 135–146)
Total Bilirubin: 0.9 mg/dL (ref 0.2–1.2)
Total Protein: 6.7 g/dL (ref 6.1–8.1)

## 2018-04-26 LAB — LIPID PANEL
CHOLESTEROL: 174 mg/dL (ref <200)
HDL: 71 mg/dL (ref >50)
LDL Cholesterol (Calc): 84 mg/dL (calc)
Non-HDL Cholesterol (Calc): 103 mg/dL (calc) (ref <130)
Total CHOL/HDL Ratio: 2.5 (calc) (ref <5.0)
Triglycerides: 99 mg/dL (ref <150)

## 2018-04-26 LAB — CBC WITH DIFFERENTIAL/PLATELET
BASOS ABS: 31 {cells}/uL (ref 0–200)
Basophils Relative: 0.6 %
EOS PCT: 2 %
Eosinophils Absolute: 102 cells/uL (ref 15–500)
HEMATOCRIT: 35.2 % (ref 35.0–45.0)
Hemoglobin: 11.8 g/dL (ref 11.7–15.5)
LYMPHS ABS: 1586 {cells}/uL (ref 850–3900)
MCH: 30.2 pg (ref 27.0–33.0)
MCHC: 33.5 g/dL (ref 32.0–36.0)
MCV: 90 fL (ref 80.0–100.0)
MPV: 11.1 fL (ref 7.5–12.5)
Monocytes Relative: 8.1 %
NEUTROS PCT: 58.2 %
Neutro Abs: 2968 cells/uL (ref 1500–7800)
Platelets: 259 10*3/uL (ref 140–400)
RBC: 3.91 10*6/uL (ref 3.80–5.10)
RDW: 12 % (ref 11.0–15.0)
Total Lymphocyte: 31.1 %
WBC mixed population: 413 cells/uL (ref 200–950)
WBC: 5.1 10*3/uL (ref 3.8–10.8)

## 2018-04-26 LAB — HEPATITIS C ANTIBODY
HEP C AB: NONREACTIVE
SIGNAL TO CUT-OFF: 0.03 (ref <1.00)

## 2018-04-26 LAB — TSH: TSH: 0.9 mIU/L

## 2018-04-26 LAB — HEPATITIS B SURFACE ANTIBODY,QUALITATIVE: Hep B S Ab: NONREACTIVE

## 2018-04-27 LAB — CYTOLOGY - PAP
CHLAMYDIA, DNA PROBE: NEGATIVE
DIAGNOSIS: NEGATIVE
HPV (WINDOPATH): NOT DETECTED
NEISSERIA GONORRHEA: NEGATIVE

## 2018-07-01 ENCOUNTER — Encounter: Payer: Self-pay | Admitting: Internal Medicine

## 2018-07-01 ENCOUNTER — Ambulatory Visit (INDEPENDENT_AMBULATORY_CARE_PROVIDER_SITE_OTHER): Payer: Self-pay | Admitting: Internal Medicine

## 2018-07-01 VITALS — BP 110/80 | HR 72 | Temp 98.2°F | Ht 60.0 in | Wt 116.0 lb

## 2018-07-01 DIAGNOSIS — T783XXA Angioneurotic edema, initial encounter: Secondary | ICD-10-CM

## 2018-07-01 DIAGNOSIS — R21 Rash and other nonspecific skin eruption: Secondary | ICD-10-CM

## 2018-07-01 DIAGNOSIS — J22 Unspecified acute lower respiratory infection: Secondary | ICD-10-CM

## 2018-07-01 LAB — POCT RAPID STREP A (OFFICE): Rapid Strep A Screen: NEGATIVE

## 2018-07-01 MED ORDER — FLUCONAZOLE 150 MG PO TABS: 150.0000 mg | ORAL_TABLET | Freq: Once | ORAL | 0 refills | Status: AC

## 2018-07-01 MED ORDER — METHYLPREDNISOLONE ACETATE 80 MG/ML IJ SUSP
80.0000 mg | Freq: Once | INTRAMUSCULAR | Status: AC
  Administered 2018-07-01: 80 mg via INTRAMUSCULAR

## 2018-07-01 MED ORDER — DOXYCYCLINE HYCLATE 100 MG PO TABS: 100.0000 mg | ORAL_TABLET | Freq: Two times a day (BID) | ORAL | 0 refills | Status: DC

## 2018-07-01 NOTE — Patient Instructions (Addendum)
Take Doxycycline 100 mg twice a day x 10 days as directed. Depomedrol 80 mg IM given.  Diflucan if needed for Candida vaginitis

## 2018-07-01 NOTE — Progress Notes (Signed)
   Subjective:    Patient ID: Natalie Oconnor, female    DOB: 09/22/1985, 32 y.o.   MRN: [id number]  HPI 32 year old Female presents with URI for over one week and lip swelling in addition to a facial rash that is somewhat itchy.  Does not recall eating any foods that would have caused her lips to swell.  Nothing unusual that was eaten.  Has had cough and congestion without documented fever.  Some slight sore throat.  Initially presented with respiratory infection symptoms at least a week ago and then subsequently developed a rash on face just a couple of days ago.  This was associated with lip swelling.  No eye swelling.  No shortness of breath or wheezing.  She works as a Advertising account plannernail technician.  No one else at home is ill.    Review of Systems no fever or chills.  No respiratory distress.     Objective:   Physical Exam She sounds nasally congested and has a congested cough.  Left TM is slightly full.  Pharynx slightly injected.  Rapid strep screen negative.  She has macular papular rash on face with some discrete papules that look a bit like acne.  There is also some scaling and flaking around her right temple which looks more like eczema.  She has swelling of both upper and lower lips.  No acute distress.  Her chest is clear without rales or wheezing and a respiratory rate is normal.  She has a tattoo on her right arm.  Cardiac exam is normal       Assessment & Plan:  Facial rash has some resemblance to acne.  Lip swelling upper and lower lips-etiology unclear consistent with angioedema  Acute lower respiratory infection with cough and congestion  Plan: Doxycycline 100 mg twice daily for 10 days.  Depo-Medrol 80 mg IM which should help angioedema and itching on face.  She is to let me know if not better in 10 days or sooner if worse.  Diflucan if needed for Candida vaginitis while on antibiotics.  May need to see dermatologist.  25 minutes spent with patient and evaluation and making  recommendations.  More than 50% of time spent discussing history of present illness, etiology, possible diagnoses and follow-up with her

## 2018-07-04 ENCOUNTER — Encounter: Payer: Self-pay | Admitting: Internal Medicine

## 2018-07-04 ENCOUNTER — Telehealth: Payer: Self-pay | Admitting: Internal Medicine

## 2018-07-04 MED ORDER — BENZONATATE 100 MG PO CAPS: 100.0000 mg | ORAL_CAPSULE | Freq: Three times a day (TID) | ORAL | 0 refills | Status: DC | PRN

## 2018-07-04 NOTE — Telephone Encounter (Signed)
Seen late last week c/o facial rash. Now c/o cough. Call in Tessalon perles. Pt requesting something for cough. Is currently on 10 day course of Doxycycline

## 2019-07-21 ENCOUNTER — Other Ambulatory Visit: Payer: Self-pay

## 2019-07-21 DIAGNOSIS — Z20822 Contact with and (suspected) exposure to covid-19: Secondary | ICD-10-CM

## 2019-07-23 LAB — NOVEL CORONAVIRUS, NAA: SARS-CoV-2, NAA: NOT DETECTED

## 2019-10-15 ENCOUNTER — Ambulatory Visit: Payer: Self-pay | Attending: Internal Medicine

## 2019-10-15 DIAGNOSIS — Z23 Encounter for immunization: Secondary | ICD-10-CM | POA: Insufficient documentation

## 2019-10-15 NOTE — Progress Notes (Signed)
   Covid-19 Vaccination Clinic  Name:  Shellyann Wandrey    MRN: [id number] DOB: 22-Jun-1986  10/15/2019  Ms. Gravois was observed post Covid-19 immunization for 15 minutes without incident. She was provided with Vaccine Information Sheet and instruction to access the V-Safe system.   Ms. Rabalais was instructed to call 911 with any severe reactions post vaccine: Marland Kitchen Difficulty breathing  . Swelling of face and throat  . A fast heartbeat  . A bad rash all over body  . Dizziness and weakness   Immunizations Administered    Name Date Dose VIS Date Route   Pfizer COVID-19 Vaccine 10/15/2019  5:36 PM 0.3 mL 07/21/2019 Intramuscular   Manufacturer: [employer], [employer]   Lot: [id number]   NDC: [address]-5

## 2019-11-15 ENCOUNTER — Ambulatory Visit: Payer: Self-pay | Attending: Internal Medicine

## 2019-11-15 DIAGNOSIS — Z23 Encounter for immunization: Secondary | ICD-10-CM

## 2019-11-15 NOTE — Progress Notes (Signed)
   Covid-19 Vaccination Clinic  Name:  Natalie Oconnor    MRN: [id number] DOB: [birth year]  11/15/2019  Natalie Oconnor was observed post Covid-19 immunization for 15 minutes without incident. She was provided with Vaccine Information Sheet and instruction to access the V-Safe system.   Natalie Oconnor was instructed to call 911 with any severe reactions post vaccine: Marland Kitchen Difficulty breathing  . Swelling of face and throat  . A fast heartbeat  . A bad rash all over body  . Dizziness and weakness   Immunizations Administered    Name Date Dose VIS Date Route   Pfizer COVID-19 Vaccine 11/15/2019  8:54 AM 0.3 mL 07/21/2019 Intramuscular   Manufacturer: [employer], [employer]   Lot: [id number]   NDC: [address]-5

## 2020-01-16 ENCOUNTER — Telehealth: Payer: Self-pay

## 2020-01-16 NOTE — Telephone Encounter (Signed)
Patient called with a fever, chills and feels dizzy, she said she thinks she has a UTI, has dysuria and her urine is reddish. This has been going on for a couple of days. She had both of her covid vaccines in March.   Call back [phone]

## 2020-01-16 NOTE — Telephone Encounter (Signed)
Left message- come today before 5 or tomorrow at 9:45am

## 2020-01-17 ENCOUNTER — Other Ambulatory Visit: Payer: Self-pay

## 2020-01-17 ENCOUNTER — Encounter: Payer: Self-pay | Admitting: Internal Medicine

## 2020-01-17 ENCOUNTER — Ambulatory Visit (INDEPENDENT_AMBULATORY_CARE_PROVIDER_SITE_OTHER): Payer: Self-pay | Admitting: Internal Medicine

## 2020-01-17 VITALS — BP 120/80 | HR 102 | Temp 98.2°F | Ht 60.0 in | Wt 120.0 lb

## 2020-01-17 DIAGNOSIS — N390 Urinary tract infection, site not specified: Secondary | ICD-10-CM

## 2020-01-17 DIAGNOSIS — R300 Dysuria: Secondary | ICD-10-CM

## 2020-01-17 DIAGNOSIS — D72829 Elevated white blood cell count, unspecified: Secondary | ICD-10-CM

## 2020-01-17 DIAGNOSIS — A419 Sepsis, unspecified organism: Secondary | ICD-10-CM

## 2020-01-17 DIAGNOSIS — N12 Tubulo-interstitial nephritis, not specified as acute or chronic: Secondary | ICD-10-CM

## 2020-01-17 LAB — POCT URINALYSIS DIPSTICK
Bilirubin, UA: NEGATIVE
Glucose, UA: NEGATIVE
Ketones, UA: POSITIVE
Nitrite, UA: NEGATIVE
Protein, UA: POSITIVE — AB
Spec Grav, UA: 1.015 (ref 1.010–1.025)
Urobilinogen, UA: 0.2 E.U./dL
pH, UA: 6.5 (ref 5.0–8.0)

## 2020-01-17 MED ORDER — CIPROFLOXACIN HCL 500 MG PO TABS: 500.0000 mg | ORAL_TABLET | Freq: Two times a day (BID) | ORAL | 0 refills | Status: DC

## 2020-01-17 MED ORDER — CEFTRIAXONE SODIUM 1 G IJ SOLR
1.0000 g | Freq: Once | INTRAMUSCULAR | Status: AC
  Administered 2020-01-17: 1 g via INTRAMUSCULAR

## 2020-01-17 MED ORDER — ONDANSETRON HCL 4 MG PO TABS: 4.0000 mg | ORAL_TABLET | Freq: Three times a day (TID) | ORAL | 0 refills | Status: DC | PRN

## 2020-01-17 NOTE — Patient Instructions (Addendum)
1 g IM Rocephin given in office.  CBC with differential pending at time of office visit.  Start Cipro 500 mg twice daily for 10 days.  Addendum: She has significant leukocytosis.  We called patient this afternoon to check on her and advised repeat office visit tomorrow for follow-up.

## 2020-01-17 NOTE — Progress Notes (Signed)
   Subjective:    Patient ID: Natalie Oconnor, female    DOB: 18-Sep-1985, 34 y.o.   MRN: [id number]  HPI 34 year old [place] Female in today with complaint of 3-day history of fever, low back pain, complaint of reddish urine and dysuria. She called yesterday, was offered appt yesterday, but could not come yesterday. Here today for evaluation.  She has 4 children.  No history of hospitalizations.  No known drug allergies.  She works as a Advertising account planner.  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  Non-smoker.  Occasionally consumes alcohol.  Remote history of asthma.  No prior history of urinary tract infections.  Social history: Has 4 children.  Resides with boyfriend.  Parents live in [place].  Family history: 2 brothers and 2 sisters in good health.  Mother and father in good health.  History of bilateral breast implants done here in [place].    Review of Systems complaining of nausea but no vomiting. Complaining of back and RLQ pain.     Objective:   Physical Exam  T 98.2 degrees BP 120/80 pulse ox 98% Weight 120 pounds. BMI 23.44. No CVA tenderness. Chest is clear. Abd: soft nondistended without tenderness.  Urine appears cloudy.  Specific gravity 1.015.  Large amount of occult blood, positive ketones, negative for glucose and bilirubin, positive for protein and small LE.  Culture was sent.  White blood cell count was ordered.      Assessment & Plan:  Based on symptoms and presentation suspect she has pyelonephritis.  Was given 1 g IM Rocephin in the office and started on Cipro 500 mg twice daily for 10 days.  Was given Zofran 4 mg tablets every 8 hours as needed for nausea.  Advised to hydrate well.  Addendum: White blood cell count is 26,800.  Hemoglobin 11 g.  Platelet count is 188,000.  When called patient and she said she was feeling about the same this afternoon.  Was seen earlier this morning.  Not vomiting.  Patient advised to take Cipro twice daily and to return tomorrow for  follow-up and stat CBC.  If symptoms worsen she is to go to the emergency department this evening.

## 2020-01-18 ENCOUNTER — Emergency Department (HOSPITAL_COMMUNITY): Payer: Self-pay

## 2020-01-18 ENCOUNTER — Encounter (HOSPITAL_COMMUNITY): Payer: Self-pay | Admitting: Emergency Medicine

## 2020-01-18 ENCOUNTER — Emergency Department (HOSPITAL_COMMUNITY)
Admission: EM | Admit: 2020-01-18 | Discharge: 2020-01-18 | Disposition: A | Discharge status: Home / Self Care | Payer: Self-pay | Attending: Emergency Medicine | Admitting: Emergency Medicine

## 2020-01-18 ENCOUNTER — Encounter: Payer: Self-pay | Admitting: Internal Medicine

## 2020-01-18 ENCOUNTER — Ambulatory Visit (INDEPENDENT_AMBULATORY_CARE_PROVIDER_SITE_OTHER): Payer: Self-pay | Admitting: Internal Medicine

## 2020-01-18 ENCOUNTER — Encounter (HOSPITAL_COMMUNITY): Payer: Self-pay

## 2020-01-18 ENCOUNTER — Other Ambulatory Visit: Payer: Self-pay

## 2020-01-18 VITALS — BP 120/70 | HR 95 | Temp 99.6°F | Ht 60.0 in | Wt 120.0 lb

## 2020-01-18 DIAGNOSIS — R509 Fever, unspecified: Secondary | ICD-10-CM | POA: Insufficient documentation

## 2020-01-18 DIAGNOSIS — J45909 Unspecified asthma, uncomplicated: Secondary | ICD-10-CM | POA: Insufficient documentation

## 2020-01-18 DIAGNOSIS — R1031 Right lower quadrant pain: Secondary | ICD-10-CM

## 2020-01-18 DIAGNOSIS — R110 Nausea: Secondary | ICD-10-CM | POA: Insufficient documentation

## 2020-01-18 DIAGNOSIS — N12 Tubulo-interstitial nephritis, not specified as acute or chronic: Secondary | ICD-10-CM | POA: Insufficient documentation

## 2020-01-18 LAB — COMPREHENSIVE METABOLIC PANEL
ALT: 60 U/L — ABNORMAL HIGH (ref 0–44)
AST: 22 U/L (ref 15–41)
Albumin: 3.2 g/dL — ABNORMAL LOW (ref 3.5–5.0)
Alkaline Phosphatase: 96 U/L (ref 38–126)
Anion gap: 11 (ref 5–15)
BUN: 8 mg/dL (ref 6–20)
CO2: 26 mmol/L (ref 22–32)
Calcium: 8.5 mg/dL — ABNORMAL LOW (ref 8.9–10.3)
Chloride: 100 mmol/L (ref 98–111)
Creatinine, Ser: 0.64 mg/dL (ref 0.44–1.00)
GFR calc Af Amer: >60 mL/min (ref >60)
GFR calc non Af Amer: >60 mL/min (ref >60)
Glucose, Bld: 111 mg/dL — ABNORMAL HIGH (ref 70–99)
Potassium: 3 mmol/L — ABNORMAL LOW (ref 3.5–5.1)
Sodium: 137 mmol/L (ref 135–145)
Total Bilirubin: 0.5 mg/dL (ref 0.3–1.2)
Total Protein: 7.4 g/dL (ref 6.5–8.1)

## 2020-01-18 LAB — CBC
HCT: 32.6 % — ABNORMAL LOW (ref 36.0–46.0)
Hemoglobin: 11 g/dL — ABNORMAL LOW (ref 12.0–15.0)
MCH: 30.7 pg (ref 26.0–34.0)
MCHC: 33.7 g/dL (ref 30.0–36.0)
MCV: 91.1 fL (ref 80.0–100.0)
Platelets: 212 10*3/uL (ref 150–400)
RBC: 3.58 MIL/uL — ABNORMAL LOW (ref 3.87–5.11)
RDW: 12.8 % (ref 11.5–15.5)
WBC: 20.8 10*3/uL — ABNORMAL HIGH (ref 4.0–10.5)
nRBC: 0 % (ref 0.0–0.2)

## 2020-01-18 LAB — I-STAT BETA HCG BLOOD, ED (MC, WL, AP ONLY): I-stat hCG, quantitative: <5 m[IU]/mL (ref <5)

## 2020-01-18 LAB — LIPASE, BLOOD: Lipase: 15 U/L (ref 11–51)

## 2020-01-18 MED ORDER — CEFTRIAXONE SODIUM 1 G IJ SOLR
1.0000 g | Freq: Once | INTRAMUSCULAR | Status: AC
  Administered 2020-01-18: 1 g via INTRAMUSCULAR

## 2020-01-18 MED ORDER — POTASSIUM CHLORIDE CRYS ER 20 MEQ PO TBCR
40.0000 meq | EXTENDED_RELEASE_TABLET | Freq: Once | ORAL | Status: AC
  Administered 2020-01-18: 40 meq via ORAL
  Filled 2020-01-18: qty 2

## 2020-01-18 MED ORDER — POTASSIUM CHLORIDE ER 20 MEQ PO TBCR
20.0000 meq | EXTENDED_RELEASE_TABLET | Freq: Every day | ORAL | 0 refills | Status: DC
Start: 2020-01-18 — End: 2020-09-26

## 2020-01-18 MED ORDER — SODIUM CHLORIDE (PF) 0.9 % IJ SOLN
INTRAMUSCULAR | Status: AC
  Filled 2020-01-18: qty 50

## 2020-01-18 MED ORDER — ACETAMINOPHEN 325 MG PO TABS
650.0000 mg | ORAL_TABLET | Freq: Once | ORAL | Status: AC
  Administered 2020-01-18: 650 mg via ORAL
  Filled 2020-01-18: qty 2

## 2020-01-18 MED ORDER — IOHEXOL 300 MG/ML  SOLN
100.0000 mL | Freq: Once | INTRAMUSCULAR | Status: AC | PRN
  Administered 2020-01-18: 100 mL via INTRAVENOUS

## 2020-01-18 MED ORDER — ONDANSETRON HCL 4 MG/2ML IJ SOLN
4.0000 mg | Freq: Once | INTRAMUSCULAR | Status: AC
  Administered 2020-01-18: 4 mg via INTRAVENOUS
  Filled 2020-01-18: qty 2

## 2020-01-18 MED ORDER — MORPHINE SULFATE (PF) 4 MG/ML IV SOLN
4.0000 mg | Freq: Once | INTRAVENOUS | Status: AC
  Administered 2020-01-18: 4 mg via INTRAVENOUS
  Filled 2020-01-18: qty 1

## 2020-01-18 NOTE — Discharge Instructions (Addendum)
Continue taking antibiotics as prescribed.  Take the entire course, even if your symptoms improve. Use Zofran as needed for nausea or vomiting. Return to the emergency room if you develop persistent vomiting, severe worsening pain, confusion/weakness, or any new, worsening, or concerning symptoms.  Potassium was slightly low today.  Take the potassium pills as prescribed.  There is also information about foods with high potassium content.  Make sure you stay well-hydrated with water.

## 2020-01-18 NOTE — ED Provider Notes (Signed)
[facility] DEPT Provider Note   CSN: [id number] Arrival date & time: 01/18/20  1009     History Chief Complaint  Patient presents with  . Abdominal Pain    Natalie Oconnor is a 34 y.o. female presenting for evaluation of right lower quadrant abdominal pain.  Patient states of the past 2 days, she has had persistent and worsening right lower quadrant abdominal pain.  She reports associated nausea and fever, no vomiting.  She was seen by her primary care doctor over the past 2 days, found to have a leukocytosis of 26.  She was thought to have early Pyelo, and this was started on Cipro yesterday, reports no improvement of symptoms.  She denies chest pain, shortness of breath, cough, urinary symptoms, abnormal bowel movements.  Nothing makes her pain better or worse.  She has not taken anything for pain.  P.o. intake does not change her pain.  Her last period was May 15, was normal for her. No vaginal discharge. HPI     Past Medical History:  Diagnosis Date  . Asthma   . Late prenatal care   . No pertinent past medical history   . Normal pregnancy 05/08/2011  . SVD (spontaneous vaginal delivery) 05/08/2011    Patient Active Problem List   Diagnosis Date Noted  . History of asthma 04/25/2018    Past Surgical History:  Procedure Laterality Date  . NO PAST SURGERIES       OB History    Gravida  4   Para  4   Term  4   Preterm      AB      Living  4     SAB      TAB      Ectopic      Multiple  0   Live Births  4           No family history on file.  Social History   Tobacco Use  . Smoking status: Never Smoker  . Smokeless tobacco: Never Used  Substance Use Topics  . Alcohol use: No  . Drug use: No    Home Medications Prior to Admission medications   Medication Sig Start Date End Date Taking? Authorizing Provider  acetaminophen (TYLENOL) 500 MG tablet Take 500-1,000 mg by mouth daily as needed for mild pain.   Yes  [provider]  ciprofloxacin (CIPRO) 500 MG tablet Take 1 tablet (500 mg total) by mouth 2 (two) times daily. 01/17/20  Yes Baxley, Cresenciano Lick, MD  ondansetron (ZOFRAN) 4 MG tablet Take 1 tablet (4 mg total) by mouth every 8 (eight) hours as needed for nausea or vomiting. 01/17/20  Yes Baxley, Cresenciano Lick, MD  potassium chloride 20 MEQ TBCR Take 20 mEq by mouth daily for 7 doses. 01/18/20 01/25/20  Gavyn Ybarra, PA-C    Allergies    Patient has no known allergies.  Review of Systems   Review of Systems  Constitutional: Positive for fever.  Gastrointestinal: Positive for abdominal pain and nausea.  All other systems reviewed and are negative.   Physical Exam Updated Vital Signs BP 130/85   Pulse 77   Temp 98.9 F (37.2 C) (Oral)   Resp 18   LMP 12/24/2019   SpO2 99%   Physical Exam Vitals and nursing note reviewed.  Constitutional:      General: She is not in acute distress.    Appearance: She is well-developed.     Comments: Appears nontoxic  HENT:     Head: Normocephalic and atraumatic.  Eyes:     Extraocular Movements: Extraocular movements intact.     Conjunctiva/sclera: Conjunctivae normal.     Pupils: Pupils are equal, round, and reactive to light.  Cardiovascular:     Rate and Rhythm: Normal rate and regular rhythm.     Pulses: Normal pulses.  Pulmonary:     Effort: Pulmonary effort is normal. No respiratory distress.     Breath sounds: Normal breath sounds. No wheezing.  Abdominal:     General: There is no distension.     Palpations: Abdomen is soft. There is no mass.     Tenderness: There is abdominal tenderness. There is no guarding or rebound.     Comments: Tenderness palpation of right lower quadrant.  Negative Rovsing's.  Negative psoas.  Negative rebound.  No peritonitis.  No tenderness palpation of right upper quadrant.  No CVA tenderness.  No rigidity, guarding, distention.  Musculoskeletal:        General: Normal range of motion.     Cervical back:  Normal range of motion and neck supple.  Skin:    General: Skin is warm and dry.  Neurological:     Mental Status: She is alert and oriented to person, place, and time.     ED Results / Procedures / Treatments   Labs (all labs ordered are listed, but only abnormal results are displayed) Labs Reviewed  COMPREHENSIVE METABOLIC PANEL - Abnormal; Notable for the following components:      Result Value   Potassium 3.0 (*)    Glucose, Bld 111 (*)    Calcium 8.5 (*)    Albumin 3.2 (*)    ALT 60 (*)    All other components within normal limits  CBC - Abnormal; Notable for the following components:   WBC 20.8 (*)    RBC 3.58 (*)    Hemoglobin 11.0 (*)    HCT 32.6 (*)    All other components within normal limits  LIPASE, BLOOD  URINALYSIS, ROUTINE W REFLEX MICROSCOPIC  I-STAT BETA HCG BLOOD, ED ([facility], [facility], [facility] ONLY)    EKG None  Radiology CT ABDOMEN PELVIS W CONTRAST  Result Date: 01/18/2020 CLINICAL DATA:  34 year old female with history of right lower quadrant abdominal pain for the past 3 days. EXAM: CT ABDOMEN AND PELVIS WITH CONTRAST TECHNIQUE: Multidetector CT imaging of the abdomen and pelvis was performed using the standard protocol following bolus administration of intravenous contrast. CONTRAST:  OMNIPAQUE IOHEXOL 300 MG/ML  SOLN COMPARISON:  No priors. FINDINGS: Lower chest: Bilateral breast implants incidentally noted. 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 No other suspicious hepatic lesions. No intra or extrahepatic biliary ductal dilatation. Gallbladder is normal in appearance. Pancreas: No pancreatic mass. No pancreatic ductal dilatation. No pancreatic or peripancreatic fluid collections or inflammatory changes. Spleen: Unremarkable. Adrenals/Urinary Tract: Striated nephrogram in the right kidney, highly concerning for severe pyelonephritis.  Left kidney and bilateral adrenal glands are unremarkable in appearance. Mild right hydronephrosis. No hydroureter. However, there is avid enhancement of the urothelium throughout the right ureter and right renal collecting system with periureteric soft tissue stranding and right perinephric soft tissue stranding. Urinary bladder is unremarkable in appearance. Stomach/Bowel: Normal appearance of the stomach. No pathologic dilatation of small bowel or colon. Normal appendix. Vascular/Lymphatic: No significant atherosclerotic disease, aneurysm or dissection noted in the abdominal  or pelvic vasculature. Reproductive: Uterus and ovaries are unremarkable in appearance. Other: Trace volume of ascites in the low anatomic pelvis. No pneumoperitoneum. Musculoskeletal: There are no aggressive appearing lytic or blastic lesions noted in the visualized portions of the skeleton. IMPRESSION: 1. Findings are highly concerning for right-sided pyelonephritis, as detailed above. Correlation with urinalysis is recommended. 2. Additional incidental findings, as above. Electronically Signed   By: Trudie Reed M.D.   On: 01/18/2020 13:38    Procedures Procedures (including critical care time)  Medications Ordered in ED Medications  sodium chloride (PF) 0.9 % injection (has no administration in time range)  acetaminophen (TYLENOL) tablet 650 mg (650 mg Oral Given 01/18/20 1138)  ondansetron (ZOFRAN) injection 4 mg (4 mg Intravenous Given 01/18/20 1158)  morphine 4 MG/ML injection 4 mg (4 mg Intravenous Given 01/18/20 1158)  iohexol (OMNIPAQUE) 300 MG/ML solution 100 mL (100 mLs Intravenous Contrast Given 01/18/20 1200)  potassium chloride SA (KLOR-CON) CR tablet 40 mEq (40 mEq Oral Given 01/18/20 1400)    ED Course  I have reviewed the triage vital signs and the nursing notes.  Pertinent labs & imaging results that were available during my care of the patient were reviewed by me and considered in my medical decision  making (see chart for details).  Clinical Course as of Jan 17 1541  Thu Jan 18, 2020  1348 34 yo female presenting with right lower abdominal pain, started on treatment for suspected pyelonephritis by her PCP 2 days ago (received rocephin in office, started on cipro BID for 10 days).  She is very well appearing on exam.  Nontoxic, no vomiting, has an appetite.  Here CT shows right pyelo, no retained stone.  UA pending.  She has a urine culture pending from her PCP's office.  Her WBC is downtrending today 20K from 26K earlier this week. Doubtful of sepsis.  Stable and well enough appearing for outpatient mgmt on antibiotics, agree with cipro plan.   [MT]  1351 Also discussed hypoK likely due ot poor appetite, will start on oral K+, pt made aware   [MT]    Clinical Course User Index [MT] Trifan, Kermit Balo, MD   MDM Rules/Calculators/A&P                          Patient presenting for evaluation of right lower quadrant domino pain with associated fever and nausea.  On exam, patient appears nontoxic.  However she does have focal right lower quadrant abdominal pain.  Concern for possible appendicitis.  Also consider kidney stone, early Pilo, UTI.  Less likely torsion or TOA.  Labs obtained in triage read interpreted by me, shows leukocytosis of 20.  Mild hypokalemia at 3, otherwise labs are reassuring.  Will obtain CT abdomen pelvis for further evaluation.  CT abdomen pelvis consistent with right-sided pyelonephritis. On reassessment, pt remains nontoxic. Temp improved as expected. WBC has improved from yesterday with 1 dose of cipro, and pt has not been on abx for more than 24 hours, as such I do not believe this is failure of OP abx. Will have pt continue abx. Oral potassium given. Case discussed with attending, Dr. Renaye Rakers evaluated the pt, and is agreeable to the plan. At this time, pt appears safe for d/c. Return precautions given. Pt states she understands and agrees to plan.   Final Clinical  Impression(s) / ED Diagnoses Final diagnoses:  Pyelonephritis of right kidney    Rx / DC Orders ED  Discharge Orders         Ordered    potassium chloride 20 MEQ TBCR  Daily     Discontinue  Reprint     01/18/20 1402           Alveria Apley, PA-C 01/18/20 1542    Terald Sleeper, MD 01/18/20 [phone]

## 2020-01-18 NOTE — Progress Notes (Signed)
   Subjective:    Patient ID: Natalie Oconnor, female    DOB: 04-25-1986, 34 y.o.   MRN: [id number]  HPI 34 year old [place] Female seen today for follow-up in office for RLQ pain and leukocytosis which was noted yesterday. Yesterday, urine was cultured due to leukocytes and large occult blood.   Results are pending.It was felt she might have pyelonephritis.  Yesterday, was given one gram IM Rocephin in office and started on Cipro twice daily. Last night did not sleep. Had RLQ pain but no nausea or vomiting.Yesterday WBC was 26,800. Was in office this am for follow up.  No labs done in office since she was not improving.  It is my impression she needs CT of the abdomen and pelvis.  I drove her to [facility] [place] emergency department for evaluation.  Please see dictation from yesterday for complete history and physical exam    Review of Systems see above     Objective:   Physical Exam T 99.6 and no acute distress AbD: soft, nondistended without hepatosplenomegaly.  Mild to moderate tenderness in right lower quadrant.  No definite rebound tenderness.      Assessment & Plan:  Right lower quadrant pain not improving  Plan: Taken to the emergency department for evaluation and CT of abdomen and pelvis.

## 2020-01-18 NOTE — ED Notes (Signed)
An After Visit Summary was printed and given to the patient. Discharge instructions given and no further questions at this time.  Pt leaving with husband. 

## 2020-01-18 NOTE — Addendum Note (Signed)
Addended by: Gregery Na on: 01/18/2020 03:37 PM   Modules accepted: Orders

## 2020-01-18 NOTE — Patient Instructions (Signed)
Taken to ED for evaluation.

## 2020-01-18 NOTE — ED Triage Notes (Signed)
Per pt, right lower quadrant pain for 3 days-worse today-no dysuria

## 2020-01-19 LAB — URINE CULTURE
MICRO NUMBER:: 10570996
SPECIMEN QUALITY:: ADEQUATE

## 2020-01-19 LAB — CBC WITH DIFFERENTIAL/PLATELET
Absolute Monocytes: 3645 cells/uL — ABNORMAL HIGH (ref 200–950)
Basophils Absolute: 27 cells/uL (ref 0–200)
Basophils Relative: 0.1 %
Eosinophils Absolute: 0 cells/uL — ABNORMAL LOW (ref 15–500)
Eosinophils Relative: 0 %
HCT: 33.5 % — ABNORMAL LOW (ref 35.0–45.0)
Hemoglobin: 11 g/dL — ABNORMAL LOW (ref 11.7–15.5)
Lymphs Abs: 1447 cells/uL (ref 850–3900)
MCH: 30.2 pg (ref 27.0–33.0)
MCHC: 32.8 g/dL (ref 32.0–36.0)
MCV: 92 fL (ref 80.0–100.0)
MPV: 12.2 fL (ref 7.5–12.5)
Monocytes Relative: 13.6 %
Neutro Abs: 21681 cells/uL — ABNORMAL HIGH (ref 1500–7800)
Neutrophils Relative %: 80.9 %
Platelets: 188 10*3/uL (ref 140–400)
RBC: 3.64 10*6/uL — ABNORMAL LOW (ref 3.80–5.10)
RDW: 12.2 % (ref 11.0–15.0)
Total Lymphocyte: 5.4 %
WBC: 26.8 10*3/uL — ABNORMAL HIGH (ref 3.8–10.8)

## 2020-01-22 ENCOUNTER — Encounter: Payer: Self-pay | Admitting: Internal Medicine

## 2020-01-22 ENCOUNTER — Other Ambulatory Visit: Payer: Self-pay

## 2020-01-22 ENCOUNTER — Ambulatory Visit (INDEPENDENT_AMBULATORY_CARE_PROVIDER_SITE_OTHER): Payer: Self-pay | Admitting: Internal Medicine

## 2020-01-22 VITALS — BP 130/88 | HR 84 | Ht 60.0 in | Wt 114.0 lb

## 2020-01-22 DIAGNOSIS — N12 Tubulo-interstitial nephritis, not specified as acute or chronic: Secondary | ICD-10-CM

## 2020-01-22 LAB — POCT URINALYSIS DIPSTICK
Appearance: NEGATIVE
Bilirubin, UA: NEGATIVE
Blood, UA: NEGATIVE
Glucose, UA: NEGATIVE
Ketones, UA: NEGATIVE
Leukocytes, UA: NEGATIVE
Nitrite, UA: NEGATIVE
Odor: NEGATIVE
Protein, UA: NEGATIVE
Spec Grav, UA: 1.01 (ref 1.010–1.025)
Urobilinogen, UA: 0.2 E.U./dL
pH, UA: 7.5 (ref 5.0–8.0)

## 2020-01-22 LAB — CBC WITH DIFFERENTIAL/PLATELET
Absolute Monocytes: 499 cells/uL (ref 200–950)
Basophils Absolute: 39 cells/uL (ref 0–200)
Basophils Relative: 0.5 %
Eosinophils Absolute: 242 cells/uL (ref 15–500)
Eosinophils Relative: 3.1 %
HCT: 37.4 % (ref 35.0–45.0)
Hemoglobin: 12.3 g/dL (ref 11.7–15.5)
Lymphs Abs: 2098 cells/uL (ref 850–3900)
MCH: 30 pg (ref 27.0–33.0)
MCHC: 32.9 g/dL (ref 32.0–36.0)
MCV: 91.2 fL (ref 80.0–100.0)
MPV: 10.1 fL (ref 7.5–12.5)
Monocytes Relative: 6.4 %
Neutro Abs: 4922 cells/uL (ref 1500–7800)
Neutrophils Relative %: 63.1 %
Platelets: 590 10*3/uL — ABNORMAL HIGH (ref 140–400)
RBC: 4.1 10*6/uL (ref 3.80–5.10)
RDW: 12.2 % (ref 11.0–15.0)
Total Lymphocyte: 26.9 %
WBC: 7.8 10*3/uL (ref 3.8–10.8)

## 2020-01-22 NOTE — Progress Notes (Signed)
   Subjective:    Patient ID: Natalie Oconnor, female    DOB: July 10, 1986, 34 y.o.   MRN: [id number]  HPI 34 year old [place] Female seen today for follow up right pyelonephritis. She presented here on June 9 with right lower quadrant abdominal pain. It was thought that she had a urinary tract infection. Urine was cloudy and had ketones and large occult blood and small LE. Culture was sent. Patient was given 1 g IM Rocephin. CBC with differential was drawn. Her white cell count was noted to be 26,800. She was started on Cipro 500 mg twice daily and given Zofran 4 mg tablets for nausea. She was seen the following day. She stated she had not slept due to pain in the right lower quadrant. She was taken to [facility] emergency department where CT scan showed no evidence of appendicitis or cholecystitis. There was mild right hydronephrosis. There was avid enhancement of the urothelium throughout the right ureter and right renal collecting system with periureteric soft tissue stranding and right perinephric soft tissue stranding. Patient was thought to have right-sided pyelonephritis. She returned. Was given another 1 g IM Rocephin and continued on Cipro. She is now here for follow-up. She is feeling much better. Getting her strength back and no longer has right lower quadrant abdominal pain. CBC repeated today is now normal at 7800 and dipstick UA is normal. Urine culture grew E. coli sensitive to Cipro.    Review of Systems no new complaints     Objective:   Physical Exam Blood pressure 130/88, pulse 84 pulse oximetry 97% BMI 22.26 weight 114 pounds. No right lower quadrant tenderness. No CVA tenderness. She looks markedly improved and in no acute distress      Assessment & Plan:  Right pyelonephritis-improved. Finish 10-day course of Cipro.

## 2020-02-03 NOTE — Patient Instructions (Addendum)
Your urine specimen is now normal. Finish course of Cipro to complete a 10-day course as previously prescribed. Return as needed.

## 2020-08-22 ENCOUNTER — Other Ambulatory Visit: Payer: Self-pay

## 2020-08-22 ENCOUNTER — Telehealth: Payer: Self-pay | Admitting: Internal Medicine

## 2020-08-22 ENCOUNTER — Ambulatory Visit (INDEPENDENT_AMBULATORY_CARE_PROVIDER_SITE_OTHER): Payer: Self-pay | Admitting: Internal Medicine

## 2020-08-22 VITALS — HR 86 | Temp 97.2°F

## 2020-08-22 DIAGNOSIS — N390 Urinary tract infection, site not specified: Secondary | ICD-10-CM

## 2020-08-22 DIAGNOSIS — R300 Dysuria: Secondary | ICD-10-CM

## 2020-08-22 DIAGNOSIS — R8290 Unspecified abnormal findings in urine: Secondary | ICD-10-CM

## 2020-08-22 DIAGNOSIS — H1033 Unspecified acute conjunctivitis, bilateral: Secondary | ICD-10-CM

## 2020-08-22 LAB — POCT URINALYSIS DIPSTICK
Appearance: NEGATIVE
Bilirubin, UA: NEGATIVE
Blood, UA: NEGATIVE
Glucose, UA: NEGATIVE
Ketones, UA: NEGATIVE
Nitrite, UA: NEGATIVE
Odor: NEGATIVE
Protein, UA: NEGATIVE
Spec Grav, UA: 1.015 (ref 1.010–1.025)
Urobilinogen, UA: 0.2 E.U./dL
pH, UA: 6.5 (ref 5.0–8.0)

## 2020-08-22 MED ORDER — OFLOXACIN 0.3 % OP SOLN: OPHTHALMIC | 0 refills | Status: DC

## 2020-08-22 MED ORDER — CIPROFLOXACIN HCL 500 MG PO TABS
500.0000 mg | ORAL_TABLET | Freq: Two times a day (BID) | ORAL | 0 refills | Status: DC
Start: 2020-08-22 — End: 2020-09-26

## 2020-08-22 NOTE — Telephone Encounter (Signed)
4:45 pm today or tomorrow

## 2020-08-22 NOTE — Telephone Encounter (Signed)
Natalie Oconnor [phone]  [name] called to say both of her eyes are red and itchy she would like to be seen today if at all possible.

## 2020-08-22 NOTE — Progress Notes (Signed)
   Subjective:    Patient ID: Natalie Oconnor, female    DOB: 29-Oct-1985, 35 y.o.   MRN: [id number]  HPI 35 year old [place] patient with history of right pyelonephritis in June 2021 presents with complaints of both eyes being red and itchy.  Also, has dysuria.  Patient denies fever or chills.  Denies back pain.  Just had recent onset of dysuria today.  No complaint of discolored urine.  Urine dipstick reveals trace leukocyte esterase.  Urine was sent for culture.  No respiratory infection symptoms just red eyes.  No drainage from eyes.  Has permanent eyeliner.  Does not use eye make-up.  No respiratory infection symptoms.  Has had 2 COVID-19 vaccines. No known drug allergies.  No chronic medications.  Non-smoker.  Occasionally consumes alcohol.  Remote history of asthma treated with Pulmicort on 1 occasion.  History of allergic rhinitis.  Social history: She has 4 children.  Resides with female partner.  Works as a Advertising account planner.    Review of Systems no nausea vomiting or back pain.     Objective:   Physical Exam Temperature 97.2 degrees with tympanic thermometer.  Pulse oximetry 98% pulse 86  Both eyes have injected conjunctivae bilaterally.  No drainage is noted from either eye.  Her chest is clear.  She has no CVA tenderness.  Abnormal urine dipstick noted.  Urine sent for culture.  Does not wear contact lenses.       Assessment & Plan:  Bilateral conjunctivitis  Acute urinary tract infection with history of episode of pyelonephritis in June 2021  Plan: Ofloxacin ophthalmic drops 2 drops in each eye 4 times a day for 5 to 7 days.  Cipro 500 mg twice daily for 7 days pending culture results.  Rest and drink fluids.

## 2020-08-22 NOTE — Telephone Encounter (Signed)
scheduled

## 2020-08-24 LAB — URINE CULTURE
MICRO NUMBER:: 11415020
SPECIMEN QUALITY:: ADEQUATE

## 2020-08-25 ENCOUNTER — Encounter: Payer: Self-pay | Admitting: Internal Medicine

## 2020-08-25 NOTE — Patient Instructions (Signed)
Take Cipro 500 mg twice daily for 7 days pending urine culture results.  Ofloxacin ophthalmic drops 2 drops in each eye 4 times a day for 5 to 7 days.

## 2020-09-02 ENCOUNTER — Other Ambulatory Visit: Payer: Self-pay

## 2020-09-02 ENCOUNTER — Encounter: Payer: Self-pay | Admitting: Internal Medicine

## 2020-09-02 ENCOUNTER — Ambulatory Visit (INDEPENDENT_AMBULATORY_CARE_PROVIDER_SITE_OTHER): Payer: Self-pay | Admitting: Internal Medicine

## 2020-09-02 VITALS — BP 130/80 | HR 88 | Temp 98.1°F | Ht 60.0 in | Wt 114.0 lb

## 2020-09-02 DIAGNOSIS — N390 Urinary tract infection, site not specified: Secondary | ICD-10-CM

## 2020-09-02 DIAGNOSIS — B9620 Unspecified Escherichia coli [E. coli] as the cause of diseases classified elsewhere: Secondary | ICD-10-CM

## 2020-09-02 LAB — POCT URINALYSIS DIPSTICK
Appearance: NEGATIVE
Bilirubin, UA: NEGATIVE
Blood, UA: NEGATIVE
Glucose, UA: NEGATIVE
Ketones, UA: NEGATIVE
Leukocytes, UA: NEGATIVE
Nitrite, UA: NEGATIVE
Odor: NEGATIVE
Protein, UA: NEGATIVE
Spec Grav, UA: 1.01 (ref 1.010–1.025)
Urobilinogen, UA: 0.2 E.U./dL
pH, UA: 6.5 (ref 5.0–8.0)

## 2020-09-02 NOTE — Progress Notes (Signed)
   Subjective:    Patient ID: Natalie Oconnor, female    DOB: Feb 12, 1986, 35 y.o.   MRN: [id number]  HPI Seen for nurse visit today  regarding recent E. Coli UTI dx January 13. Was treated with Cipro for 7 days. Hx right pyelonephritis in June 2021 with E.coli UTI. Patient was seen  January 13th here in office with bacterial conjuncitivits in addition to UTI.   No complaints today. See for nurse visit only  Review of Systems see above     Objective:   Physical Exam BP 130/80 pulse 88 Temp 98.1 degrees pulse ox 98% Weight 114 pounds       Assessment & Plan:  E.coli UTI- recurrent  - she had initial episode June 2021  Patient to advise [place] if has recurrent symptoms.  Plan: Dipstick urine specimen shows no evidence of UTI. Return prn

## 2020-09-02 NOTE — Patient Instructions (Signed)
E. coli UTI has resolved

## 2020-09-16 ENCOUNTER — Telehealth: Payer: Self-pay

## 2020-09-16 NOTE — Telephone Encounter (Signed)
Patient notified of Dr. Beryle Quant instructions, provided patient with Dorcas Mcmurray number.

## 2020-09-16 NOTE — Telephone Encounter (Signed)
Patient has really dried and itchy skin on her hands every time she scratches it cracks it has been going on for years off and on. She would like to be seen.

## 2020-09-16 NOTE — Telephone Encounter (Signed)
This is best handled by Dermatologist. [facility].

## 2020-09-26 ENCOUNTER — Encounter: Payer: Self-pay | Admitting: Internal Medicine

## 2020-09-26 ENCOUNTER — Other Ambulatory Visit: Payer: Self-pay

## 2020-09-26 ENCOUNTER — Ambulatory Visit (INDEPENDENT_AMBULATORY_CARE_PROVIDER_SITE_OTHER): Payer: Self-pay | Admitting: Internal Medicine

## 2020-09-26 ENCOUNTER — Telehealth: Payer: Self-pay | Admitting: Internal Medicine

## 2020-09-26 VITALS — BP 160/90 | HR 60 | Temp 97.8°F | Ht 60.0 in | Wt 116.0 lb

## 2020-09-26 DIAGNOSIS — R319 Hematuria, unspecified: Secondary | ICD-10-CM

## 2020-09-26 LAB — POCT URINALYSIS DIPSTICK
Appearance: NEGATIVE
Bilirubin, UA: NEGATIVE
Glucose, UA: NEGATIVE
Ketones, UA: NEGATIVE
Leukocytes, UA: NEGATIVE
Nitrite, UA: NEGATIVE
Odor: NEGATIVE
Protein, UA: NEGATIVE
Spec Grav, UA: 1.01 (ref 1.010–1.025)
Urobilinogen, UA: 0.2 E.U./dL
pH, UA: 6.5 (ref 5.0–8.0)

## 2020-09-26 NOTE — Telephone Encounter (Signed)
Scheduled appointment

## 2020-09-26 NOTE — Telephone Encounter (Signed)
Natalie Oconnor [phone]  [name] called to say she is bleeding but it is not her period.

## 2020-09-26 NOTE — Progress Notes (Signed)
   Subjective:    Patient ID: Natalie Oconnor, female    DOB: [birth year], 35 y.o.   MRN: [id number]  HPI 35 year old [place] Female currently not on birth control and sexually active with female partner was seen in June 2021 for the first time with right pyelonephritis.  Culture grew E. coli.  She was treated with Cipro and improved.  She returned in January 2022 complaining of dysuria.  Urine dipstick showed trace LE.  She was placed on Cipro for 7 days.  Culture once again grew E. coli sensitive to Cipro.  Today she called complaining of blood after urination but said it was not time for her.  Pap smear in 2019 was normal.  Patient had delivery of female infant in 2016.  We saw her for the first time in September 2019.  Review of Systems denies abdominal cramping.  Last menstrual period was about 2 weeks ago.     Objective:   Physical Exam  Blood pressure is elevated today at 160/90.  Previous blood pressure readings in June 2021 were 130/88 and 120/70, and 120/80.  She denies being anxious.  She has blood coming from cervical os.  She is in no acute distress.  CBC with differential and serum hCG drawn.    Assessment & Plan:  Abnormal uterine bleeding  Plan: Patient will be referred to GYN (Dr. Senaida Ores) physician.  She is not anemic and serum hCG is negative.  Has blood in her urine but it is actually from her cervix.

## 2020-09-27 LAB — CBC WITH DIFFERENTIAL/PLATELET
Absolute Monocytes: 539 cells/uL (ref 200–950)
Basophils Absolute: 52 cells/uL (ref 0–200)
Basophils Relative: 0.6 %
Eosinophils Absolute: 87 cells/uL (ref 15–500)
Eosinophils Relative: 1 %
HCT: 39.6 % (ref 35.0–45.0)
Hemoglobin: 13.1 g/dL (ref 11.7–15.5)
Lymphs Abs: 3176 cells/uL (ref 850–3900)
MCH: 30.9 pg (ref 27.0–33.0)
MCHC: 33.1 g/dL (ref 32.0–36.0)
MCV: 93.4 fL (ref 80.0–100.0)
MPV: 11.4 fL (ref 7.5–12.5)
Monocytes Relative: 6.2 %
Neutro Abs: 4846 cells/uL (ref 1500–7800)
Neutrophils Relative %: 55.7 %
Platelets: 272 10*3/uL (ref 140–400)
RBC: 4.24 10*6/uL (ref 3.80–5.10)
RDW: 12.2 % (ref 11.0–15.0)
Total Lymphocyte: 36.5 %
WBC: 8.7 10*3/uL (ref 3.8–10.8)

## 2020-09-27 LAB — HCG, QUANTITATIVE, PREGNANCY: HCG, Total, QN: <3 m[IU]/mL

## 2020-10-07 NOTE — Patient Instructions (Addendum)
Patient being referred to Dr. Huel Cote, GYN for abnormal menstrual bleeding.  She is not anemic and serum hCG is negative.

## 2021-05-12 IMAGING — CT CT ABD-PELV W/ CM
2 of 4 series · 16 of 46 positions shown, 18 images · IV contrast (omnipaque)
Comparison: No priors.

CLINICAL DATA: 33-year-old female with history of right lower
quadrant abdominal pain for the past 3 days.

EXAM:
CT ABDOMEN AND PELVIS WITH CONTRAST
TECHNIQUE: Multidetector CT imaging of the abdomen and pelvis was performed
using the standard protocol following bolus administration of
intravenous contrast.
CONTRAST:  100mL OMNIPAQUE IOHEXOL 300 MG/ML  SOLN

[Series 2: axial st · axial · 0.68mm/px · z∈[+1072,+1472]mm · 13 of 91 slices shown, 15 images]
[im 6/91  soft-tissue]
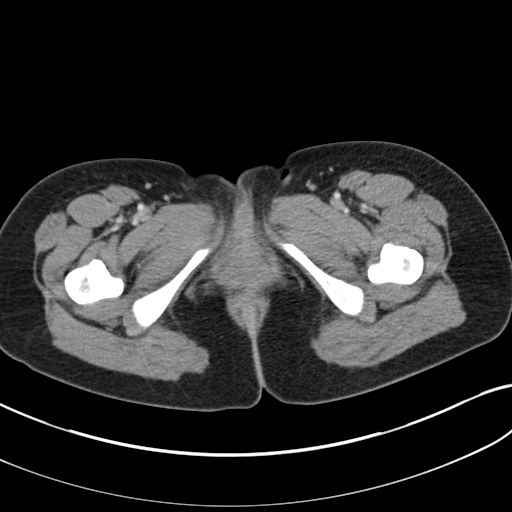
[im 6/91  bone]
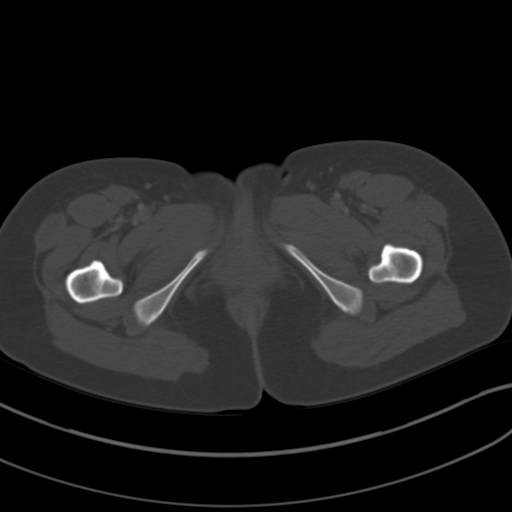
[im 11/91  soft-tissue]
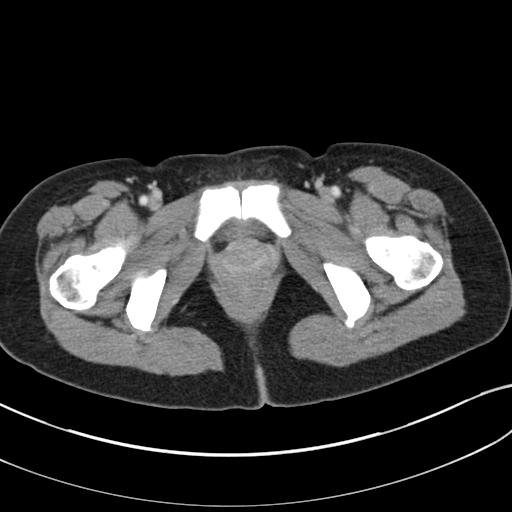
[im 21/91  soft-tissue]
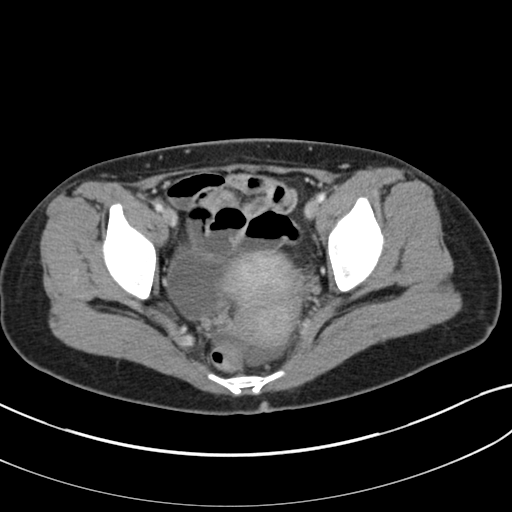
[im 26/91  soft-tissue]
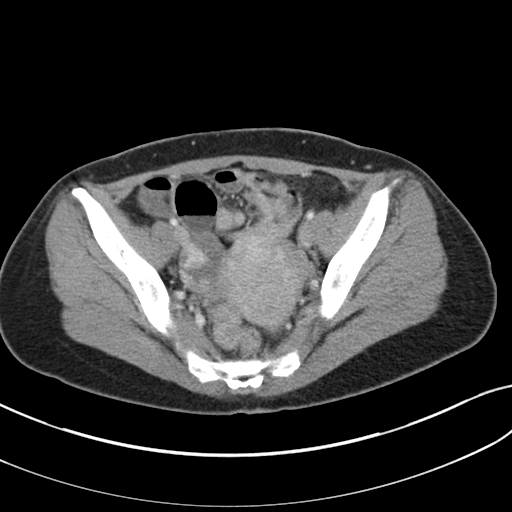
[im 31/91  soft-tissue]
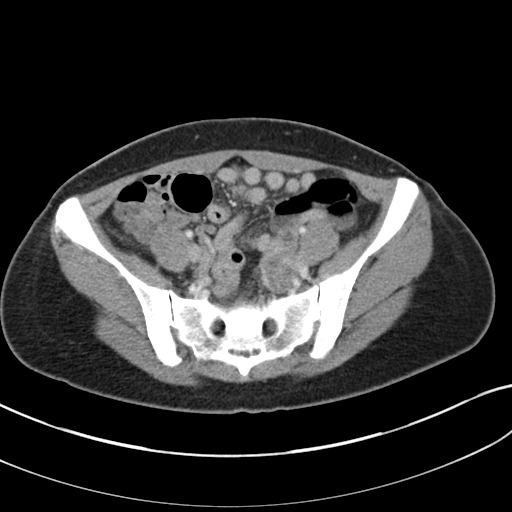
[im 41/91  soft-tissue]
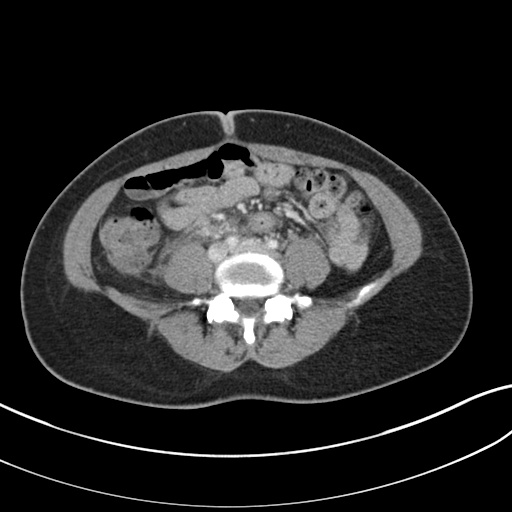
[im 46/91  soft-tissue]
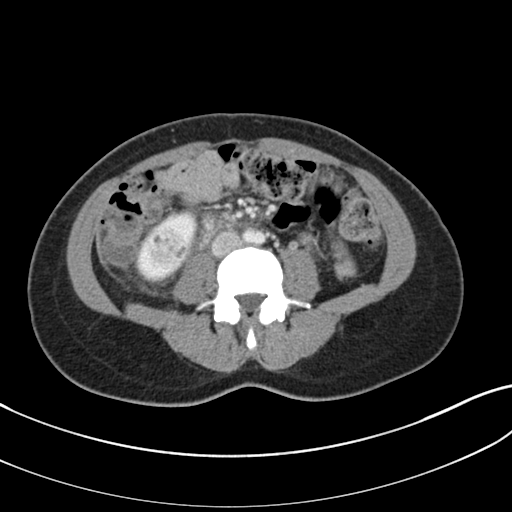
[im 51/91  soft-tissue]
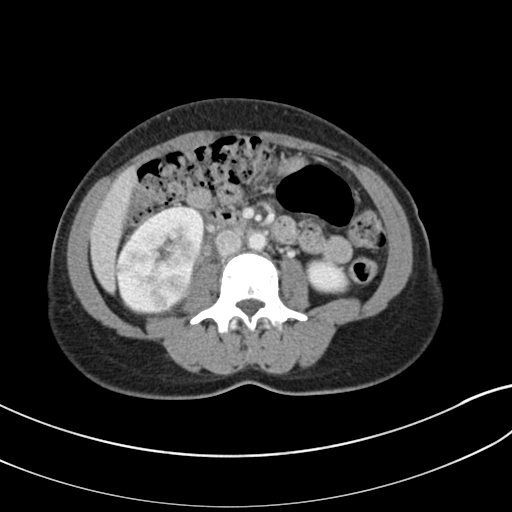
[im 61/91  soft-tissue]
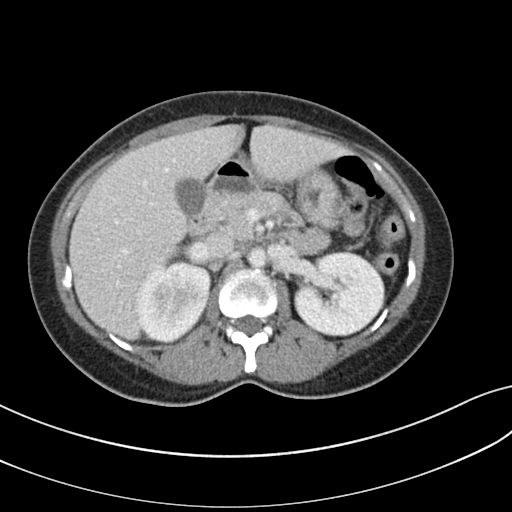
[im 61/91  bone]
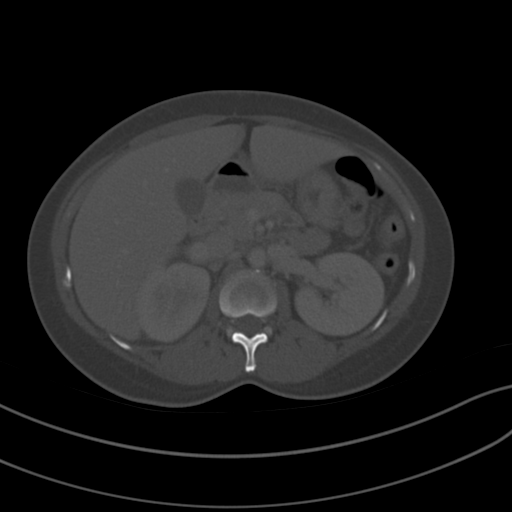
[im 66/91  soft-tissue]
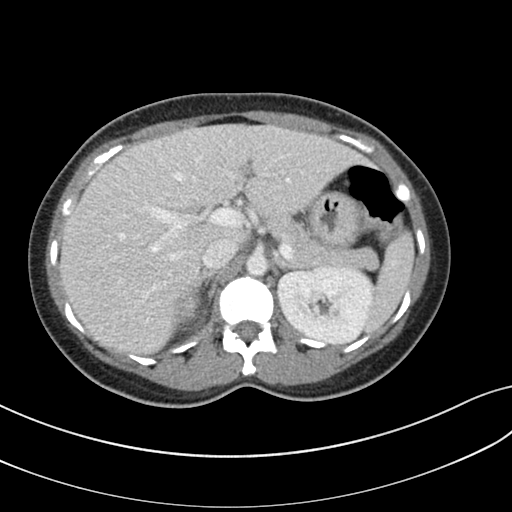
[im 71/91  soft-tissue]
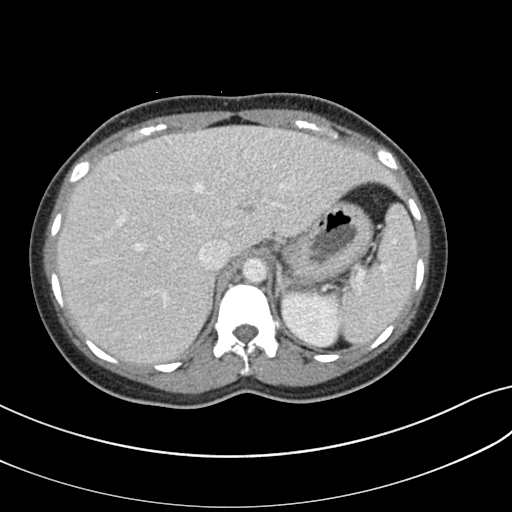
[im 81/91  soft-tissue]
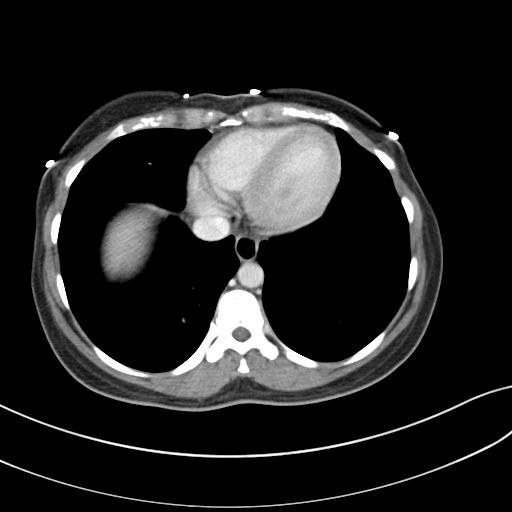
[im 86/91  soft-tissue]
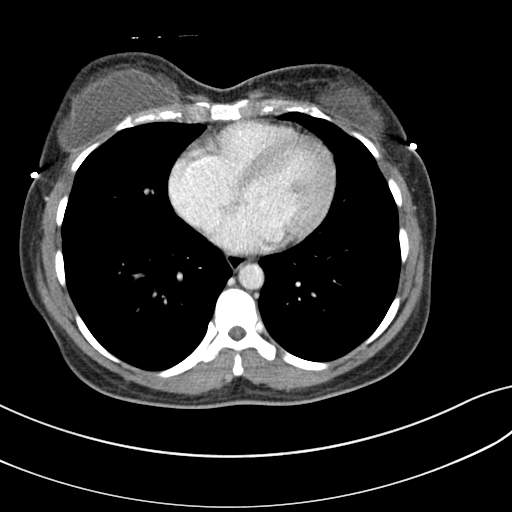

[Series 4: coronal st · coronal · 0.69mm/px · 3 of 112 slices shown]
[im 38/112  soft-tissue]
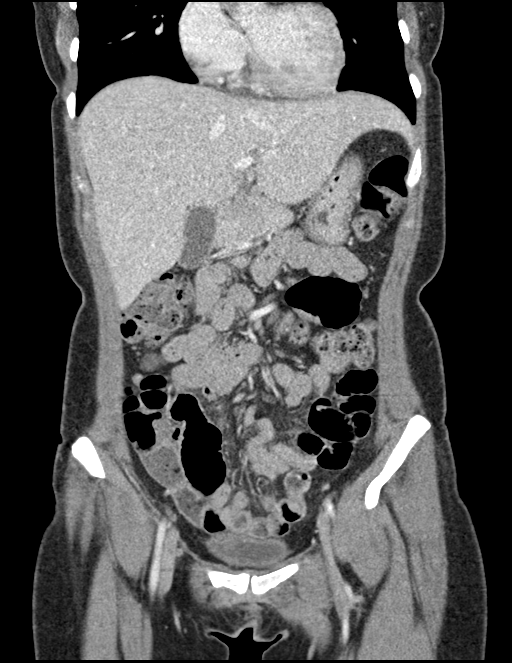
[im 50/112  soft-tissue]
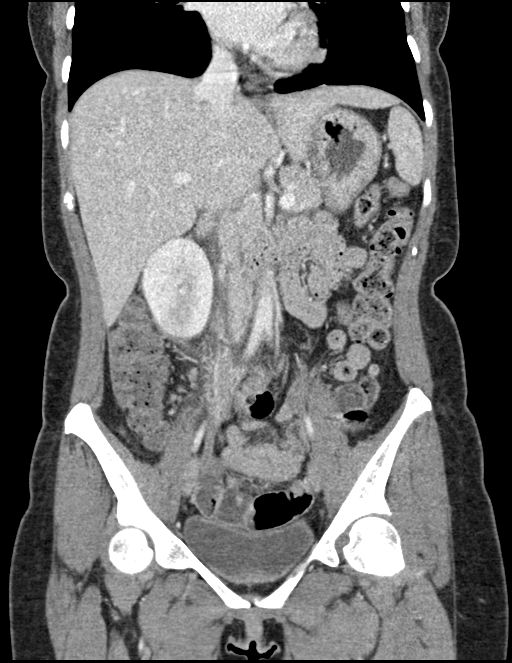
[im 62/112  soft-tissue]
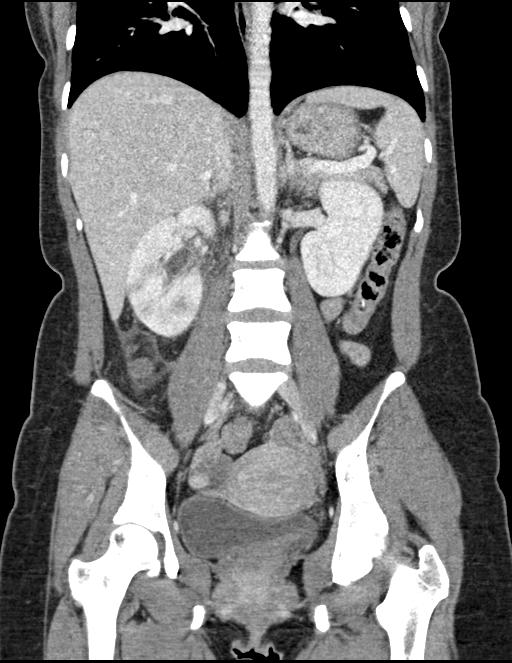

[16 of 46 positions shown; findings below may reference images not displayed]

FINDINGS: Lower chest: Bilateral breast implants incidentally noted.

Hepatobiliary: 9 mm fatty attenuation lesion associated with the
superior aspect of segment 8 of the liver, likely to represent a
small pseudolipoma of Mirqesem capsule. No other suspicious hepatic
lesions. No intra or extrahepatic biliary ductal dilatation.
Gallbladder is normal in appearance.

Pancreas: No pancreatic mass. No pancreatic ductal dilatation. No
pancreatic or peripancreatic fluid collections or inflammatory
changes.

Spleen: Unremarkable.

Adrenals/Urinary Tract: Striated nephrogram in the right kidney,
highly concerning for severe pyelonephritis. Left kidney and
bilateral adrenal glands are unremarkable in appearance. Mild right
hydronephrosis. No hydroureter. However, there is avid enhancement
of the urothelium throughout the right ureter and right renal
collecting system with periureteric soft tissue stranding and right
perinephric soft tissue stranding. Urinary bladder is unremarkable
in appearance.

Stomach/Bowel: Normal appearance of the stomach. No pathologic
dilatation of small bowel or colon. Normal appendix.

Vascular/Lymphatic: No significant atherosclerotic disease, aneurysm
or dissection noted in the abdominal or pelvic vasculature.

Reproductive: Uterus and ovaries are unremarkable in appearance.

Other: Trace volume of ascites in the low anatomic pelvis. No
pneumoperitoneum.

Musculoskeletal: There are no aggressive appearing lytic or blastic
lesions noted in the visualized portions of the skeleton.
IMPRESSION: 1. Findings are highly concerning for right-sided pyelonephritis, as
detailed above. Correlation with urinalysis is recommended.
2. Additional incidental findings, as above.

## 2021-06-16 ENCOUNTER — Ambulatory Visit (INDEPENDENT_AMBULATORY_CARE_PROVIDER_SITE_OTHER): Payer: Self-pay | Admitting: Internal Medicine

## 2021-06-16 ENCOUNTER — Telehealth: Payer: Self-pay

## 2021-06-16 ENCOUNTER — Encounter: Payer: Self-pay | Admitting: Internal Medicine

## 2021-06-16 ENCOUNTER — Other Ambulatory Visit: Payer: Self-pay

## 2021-06-16 VITALS — BP 180/100 | HR 61 | Temp 98.4°F | Ht 60.0 in | Wt 122.0 lb

## 2021-06-16 DIAGNOSIS — Z87448 Personal history of other diseases of urinary system: Secondary | ICD-10-CM

## 2021-06-16 DIAGNOSIS — L301 Dyshidrosis [pompholyx]: Secondary | ICD-10-CM

## 2021-06-16 DIAGNOSIS — R030 Elevated blood-pressure reading, without diagnosis of hypertension: Secondary | ICD-10-CM

## 2021-06-16 LAB — POCT URINALYSIS DIPSTICK
Bilirubin, UA: NEGATIVE
Blood, UA: NEGATIVE
Glucose, UA: NEGATIVE
Ketones, UA: NEGATIVE
Leukocytes, UA: NEGATIVE
Nitrite, UA: NEGATIVE
Protein, UA: NEGATIVE
Spec Grav, UA: 1.015 (ref 1.010–1.025)
Urobilinogen, UA: 0.2 E.U./dL
pH, UA: 5.5 (ref 5.0–8.0)

## 2021-06-16 MED ORDER — AMLODIPINE BESYLATE 5 MG PO TABS: 5.0000 mg | ORAL_TABLET | Freq: Every day | ORAL | 1 refills | Status: DC

## 2021-06-16 NOTE — Telephone Encounter (Signed)
Patient called asking to be seen., She has a dry skin rash on her hands and would like you to look at it. Please advise.

## 2021-06-16 NOTE — Patient Instructions (Addendum)
Start Norvasc 5 mg daily for hypertension. Labs drawn for elevated BP. Return in 3 weeks. Consider wearing cotton gloves underneath your work gloves.Use prescription cream on hands sparingly 3 times a day.

## 2021-06-16 NOTE — Telephone Encounter (Signed)
scheduled

## 2021-06-16 NOTE — Progress Notes (Signed)
   Subjective:    Patient ID: Natalie Oconnor, female    DOB: February 02, 1986, 35 y.o.   MRN: [id number]  HPI 35 year old Female called to say she wanted me to look at a dry skin rash on her hands. It turns out this has been present for several years. She has seen a Dermatologist in the past. We were able to determine she had been seen at Dr. Onalee Hua office several years ago.She is employed as a Scientist, forensic. Remote history of asthma and allergic rhinitis.On arrival, her BP was markedly elevated. No prior history of HTN. BP was 160/90 in February 2022 when seen for abnormal uterine bleeding. Hx right pyelonephritis June 2021. Has tried different preparations over the years for hand dermatitis including 2.5% hydrocortisone cream she says.  Has hx of allergic rhinitis and asthma.  She has 4 children.  No history of illnesses requiring hospitalizations.  No accidents.  No known drug allergies.  No chronic medications.  Has an IUD per GYN physician.  Parents live in [place].  She came to the [place] in 2001.  Non-smoker.  Occasionally consumes alcohol.  Family history: Parents are in good health.  2 brothers and 2 sisters in good health.  History of bilateral breast implants done here in [place].  Review of Systems No prior history of HTN     Objective:   Physical Exam Blood pressure markedly elevated today.  She does not seem to be particularly anxious.  Blood pressure was rechecked several times during the visit.  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  There are scattered areas that are rough and dry.  Some of the areas have fissures.  Does not seem to have secondary bacterial infection.  Her chest is clear to auscultation.  Cardiac exam: Regular rate and rhythm.  No lower extremity pitting edema.       Assessment & Plan:  Elevated blood pressure readings.  Blood pressure was checked several times.  Initially was 200/100 on arrival.  Remained persistently elevated at 160 to 180 on  recheck.  She has been started on Norvasc 5 mg daily with follow-up in 3 weeks.  Hand dermatitis-will be referred to Dermatologist.  In the meantime has been given triamcinolone cream 0.1% 60 g mixed with 4 ounces of Eucerin cream to use topically on hands 3 times daily.  Due to elevated blood pressure and hand dermatitis, we drew today CBC with differential, c-Met, and sed rate.  Urine dipstick was normal.

## 2021-06-17 LAB — COMPLETE METABOLIC PANEL WITH GFR
AG Ratio: 1.7 (calc) (ref 1.0–2.5)
ALT: 17 U/L (ref 6–29)
AST: 21 U/L (ref 10–30)
Albumin: 4.5 g/dL (ref 3.6–5.1)
Alkaline phosphatase (APISO): 38 U/L (ref 31–125)
BUN: 15 mg/dL (ref 7–25)
CO2: 24 mmol/L (ref 20–32)
Calcium: 9 mg/dL (ref 8.6–10.2)
Chloride: 105 mmol/L (ref 98–110)
Creat: 0.65 mg/dL (ref 0.50–0.97)
Globulin: 2.7 g/dL (calc) (ref 1.9–3.7)
Glucose, Bld: 139 mg/dL — ABNORMAL HIGH (ref 65–99)
Potassium: 4 mmol/L (ref 3.5–5.3)
Sodium: 137 mmol/L (ref 135–146)
Total Bilirubin: 0.3 mg/dL (ref 0.2–1.2)
Total Protein: 7.2 g/dL (ref 6.1–8.1)
eGFR: 118 mL/min/{1.73_m2} (ref >=60)

## 2021-06-17 LAB — CBC WITH DIFFERENTIAL/PLATELET
Absolute Monocytes: 537 cells/uL (ref 200–950)
Basophils Absolute: 36 cells/uL (ref 0–200)
Basophils Relative: 0.4 %
Eosinophils Absolute: 173 cells/uL (ref 15–500)
Eosinophils Relative: 1.9 %
HCT: 37.8 % (ref 35.0–45.0)
Hemoglobin: 12.1 g/dL (ref 11.7–15.5)
Lymphs Abs: 2612 cells/uL (ref 850–3900)
MCH: 30.3 pg (ref 27.0–33.0)
MCHC: 32 g/dL (ref 32.0–36.0)
MCV: 94.7 fL (ref 80.0–100.0)
MPV: 12 fL (ref 7.5–12.5)
Monocytes Relative: 5.9 %
Neutro Abs: 5742 cells/uL (ref 1500–7800)
Neutrophils Relative %: 63.1 %
Platelets: 208 10*3/uL (ref 140–400)
RBC: 3.99 10*6/uL (ref 3.80–5.10)
RDW: 12.2 % (ref 11.0–15.0)
Total Lymphocyte: 28.7 %
WBC: 9.1 10*3/uL (ref 3.8–10.8)

## 2021-06-17 LAB — SEDIMENTATION RATE: Sed Rate: 6 mm/h (ref 0–20)

## 2021-06-30 ENCOUNTER — Encounter: Payer: Self-pay | Admitting: Internal Medicine

## 2021-06-30 ENCOUNTER — Telehealth: Payer: Self-pay

## 2021-06-30 ENCOUNTER — Ambulatory Visit (INDEPENDENT_AMBULATORY_CARE_PROVIDER_SITE_OTHER): Payer: Self-pay | Admitting: Internal Medicine

## 2021-06-30 ENCOUNTER — Other Ambulatory Visit: Payer: Self-pay

## 2021-06-30 VITALS — BP 148/90 | HR 69 | Temp 98.0°F | Ht 60.0 in | Wt 121.0 lb

## 2021-06-30 DIAGNOSIS — N926 Irregular menstruation, unspecified: Secondary | ICD-10-CM

## 2021-06-30 DIAGNOSIS — I10 Essential (primary) hypertension: Secondary | ICD-10-CM

## 2021-06-30 MED ORDER — LOSARTAN POTASSIUM 50 MG PO TABS
50.0000 mg | ORAL_TABLET | Freq: Every day | ORAL | 1 refills | Status: DC
Start: 2021-06-30 — End: 2023-10-28

## 2021-06-30 NOTE — Progress Notes (Addendum)
   Subjective:    Patient ID: Natalie Oconnor, female    DOB: 1985-12-11, 35 y.o.   MRN: [id number]  HPI 35 year old Female called saying she had very little flow with menstrual period and wanted to be checked out. Says it is usually heavier. At last visit, BP was elevated and she was started on Norvasc 5 mg daily.  In looking back at her other visits blood pressure was generally elevated during those visits.    Review of Systems denies headache or abdominal cramping     Objective:   Physical Exam BP 148/80, pulse 69 ,Weight 121 pounds ,BMI 23.63 Neck is supple.  Chest clear to auscultation.  Cardiac exam: Regular rate and rhythm.  No lower extremity pitting edema.  Pelvic exam.  There is adequate menstrual flow in the vaginal vault and fresh blood coming out of the cervical os.      Assessment & Plan:  Essential hypertension-Add losartan 50 mg daily to Norvasc 5 mg daily and follow-up week after Thanksgiving as originally planned.  She will need basic metabolic panel at that time.  Patient called complaining of decreased menses.  It looks like there is adequate menstrual flow and at this time.  Patient reassured.  Follow-up November 28 as originally planned

## 2021-06-30 NOTE — Telephone Encounter (Signed)
Patient states that she is on her period but that there is hardly any blood. She would like checked out for this. Please advise.

## 2021-06-30 NOTE — Telephone Encounter (Signed)
Appt made for today at 245

## 2021-06-30 NOTE — Patient Instructions (Addendum)
Add losartan 50 mg daily  and continue  Norvasc 5 mg daily.  Follow-up November 28 as originally planned.  Menstrual flow looks adequate.

## 2021-07-07 ENCOUNTER — Encounter: Payer: Self-pay | Admitting: Internal Medicine

## 2021-07-07 ENCOUNTER — Other Ambulatory Visit: Payer: Self-pay

## 2021-07-07 ENCOUNTER — Ambulatory Visit (INDEPENDENT_AMBULATORY_CARE_PROVIDER_SITE_OTHER): Payer: Self-pay | Admitting: Internal Medicine

## 2021-07-07 VITALS — BP 130/70 | HR 62 | Temp 97.7°F | Ht 60.0 in | Wt 121.0 lb

## 2021-07-07 DIAGNOSIS — I10 Essential (primary) hypertension: Secondary | ICD-10-CM

## 2021-07-07 DIAGNOSIS — Z23 Encounter for immunization: Secondary | ICD-10-CM

## 2021-07-07 NOTE — Patient Instructions (Signed)
Basic metabolic panel ordered today for follow-up therapy.  Flu vaccine given.  Return in 6 months for follow-up.  Continue losartan and amlodipine for high blood pressure.

## 2021-07-07 NOTE — Progress Notes (Deleted)
ma

## 2021-07-08 ENCOUNTER — Encounter: Payer: Self-pay | Admitting: Internal Medicine

## 2021-07-08 ENCOUNTER — Ambulatory Visit (INDEPENDENT_AMBULATORY_CARE_PROVIDER_SITE_OTHER): Payer: Self-pay | Admitting: Internal Medicine

## 2021-07-08 ENCOUNTER — Telehealth: Payer: Self-pay

## 2021-07-08 VITALS — BP 138/90 | HR 77 | Temp 98.0°F | Ht 60.0 in | Wt 121.0 lb

## 2021-07-08 DIAGNOSIS — R300 Dysuria: Secondary | ICD-10-CM

## 2021-07-08 DIAGNOSIS — Z87448 Personal history of other diseases of urinary system: Secondary | ICD-10-CM

## 2021-07-08 DIAGNOSIS — N3001 Acute cystitis with hematuria: Secondary | ICD-10-CM

## 2021-07-08 LAB — BASIC METABOLIC PANEL
BUN/Creatinine Ratio: 24 (calc) — ABNORMAL HIGH (ref 6–22)
BUN: 11 mg/dL (ref 7–25)
CO2: 29 mmol/L (ref 20–32)
Calcium: 9.1 mg/dL (ref 8.6–10.2)
Chloride: 104 mmol/L (ref 98–110)
Creat: 0.46 mg/dL — ABNORMAL LOW (ref 0.50–0.97)
Glucose, Bld: 97 mg/dL (ref 65–99)
Potassium: 4.1 mmol/L (ref 3.5–5.3)
Sodium: 140 mmol/L (ref 135–146)

## 2021-07-08 LAB — POCT URINALYSIS DIPSTICK
Bilirubin, UA: NEGATIVE
Glucose, UA: NEGATIVE
Ketones, UA: NEGATIVE
Nitrite, UA: NEGATIVE
Protein, UA: NEGATIVE
Spec Grav, UA: 1.02 (ref 1.010–1.025)
Urobilinogen, UA: 0.2 E.U./dL
pH, UA: 7 (ref 5.0–8.0)

## 2021-07-08 MED ORDER — CIPROFLOXACIN HCL 500 MG PO TABS: 500.0000 mg | ORAL_TABLET | Freq: Two times a day (BID) | ORAL | 0 refills | Status: DC

## 2021-07-08 NOTE — Progress Notes (Signed)
   Subjective:    Patient ID: Natalie Oconnor, female    DOB: 04/23/1986, 35 y.o.   MRN: [id number]  HPI She returns today with complaint of urinary frequency and dysuria.  She was just here yesterday for follow-up on hypertension.  Says symptoms did not start until last night.  History of E. coli UTI in January 2022.  History of pyelonephritis of right kidney in June 2021.  Patient is married and sexually active.  Says she urinates after intercourse.  History of hypertension treated with amlodipine and losartan 50 mg daily    Review of Systems no nausea, vomiting, fever or shaking chills     Objective:   Physical Exam Blood pressure today 138/90 pulse 77 temperature 98 degrees orally pulse oximetry 98% weight 121 pounds BMI 23.63  On urine dipstick she has trace occult blood and trace LE.  No CVA tenderness.     Assessment & Plan:  Acute cystitis  Essential hypertension-stable on current regimen  Plan: Urine culture was sent today.  She will be treated with Cipro 500 mg twice daily for 7 days pending culture results.  Plan to get repeat urine specimen in about 10 days

## 2021-07-08 NOTE — Patient Instructions (Addendum)
You have a bladder infection.  Urine culture has been sent.  Take Cipro 500 mg by mouth twice daily for 7 days pending urine culture results.  Continue hypertensive medication.  We will notify you with culture results.  Plan to check repeat urine specimen in about 10 days

## 2021-07-08 NOTE — Progress Notes (Signed)
   Subjective:    Patient ID: Natalie Oconnor, female    DOB: [birth year], 35 y.o.   MRN: [id number]  HPI 35 year old female seen in early November for a dry skin rash on her hands.  Has been present for several years and has seen dermatologist in the past.  Upon arrival her blood pressure was markedly elevated on November 7.  No previous history of hypertension.  Blood pressure was 160/90 in February 2022 when seen for abnormal uterine bleeding.  She was treated as an outpatient for right pyelonephritis in June 2021.  History of allergic rhinitis and asthma.  During her visit on November 7 her blood pressure was checked several times.  Initially was 200/100 on arrival and remained persistently elevated at 1 60-1 80 on recheck.  She was started on Norvasc 5 mg daily and is here for follow-up.  Regarding her hand dermatitis she was referred to dermatologist but was given triamcinolone cream mixed with 4 ounces of Eucerin to use topically on hands 3 times a day on November 7.  Tolerating blood pressure without issues.  Blood pressure today is excellent at 130/70 and she has no side effects.  Feels well.    Review of Systems see above     Objective:   Physical Exam Neck is supple.  No thyromegaly.  No carotid bruits.  Chest clear.  Cardiac exam: Regular rate and rhythm.  Her hands have improved significantly.       Assessment & Plan:   Essential hypertension-stable on Norvasc 5 mg daily  Hand dermatitis-looks improved with triamcinolone and Eucerin cream  History of right pyelonephritis June 2021 treated as an outpatient  Had E. coli UTI in January 2022 treated with Cipro  Plan: Recommend health maintenance exam in 6 months.

## 2021-07-08 NOTE — Telephone Encounter (Signed)
Appt made for this afternoon

## 2021-07-08 NOTE — Telephone Encounter (Signed)
Patient states that you discussed Uti yesterday and she had no symptoms. She states that she started having burning every time she urinates since last night. She states you were going to give her medicine. Please advise.

## 2021-07-09 LAB — URINE CULTURE
MICRO NUMBER:: 12689895
SPECIMEN QUALITY:: ADEQUATE

## 2021-07-15 ENCOUNTER — Other Ambulatory Visit: Payer: Self-pay

## 2021-07-15 ENCOUNTER — Ambulatory Visit (INDEPENDENT_AMBULATORY_CARE_PROVIDER_SITE_OTHER): Payer: Self-pay | Admitting: Internal Medicine

## 2021-07-15 VITALS — BP 138/74 | HR 62 | Temp 97.8°F | Resp 16 | Ht 60.0 in | Wt 122.0 lb

## 2021-07-15 DIAGNOSIS — R319 Hematuria, unspecified: Secondary | ICD-10-CM

## 2021-07-15 DIAGNOSIS — I10 Essential (primary) hypertension: Secondary | ICD-10-CM

## 2021-07-15 DIAGNOSIS — Z87448 Personal history of other diseases of urinary system: Secondary | ICD-10-CM

## 2021-07-15 DIAGNOSIS — N342 Other urethritis: Secondary | ICD-10-CM

## 2021-07-15 LAB — POCT URINALYSIS DIP (MANUAL ENTRY)
Bilirubin, UA: NEGATIVE
Glucose, UA: NEGATIVE mg/dL
Ketones, POC UA: NEGATIVE mg/dL
Leukocytes, UA: NEGATIVE
Nitrite, UA: NEGATIVE
Protein Ur, POC: NEGATIVE mg/dL
Spec Grav, UA: 1.015 (ref 1.010–1.025)
Urobilinogen, UA: 0.2 E.U./dL
pH, UA: 5 (ref 5.0–8.0)

## 2021-07-16 NOTE — Progress Notes (Signed)
   Subjective:    Patient ID: Natalie Oconnor, female    DOB: Oct 15, 1985, 35 y.o.   MRN: [id number]  HPI 35 year old [place] Female seen on November 29 with complaint of urinary frequency and dysuria.  History of E. coli UTI in January 2022.  History of pyelonephritis of right kidney in June 2021.  She is married and is sexually active.  She urinates after intercourse.  History of hypertension treated with amlodipine and losartan.  On November 29 urine dipstick had trace occult blood and trace LE.  No CVA tenderness.  Culture was sent.  She was treated with Cipro 500 mg twice daily for 7 days.  She is now here for follow-up.  Urine culture on November 29 had less than 10,000 colony-forming units per milliliter of single gram-positive organism.  Urinalysis today shows trace occult blood.  No LE or nitrite noted.  Suspect she had urethritis rather than cystitis at last visit.  She is reassured.  Return as needed.    Review of Systems     Objective:   Physical Exam        Assessment & Plan:

## 2021-08-05 NOTE — Patient Instructions (Signed)
Recent urine culture did not show a significant pathogen.  Urine specimen today shows trace occult blood and no bacteria or white cells.  Blood pressure is stable on current regimen.  Return as needed.

## 2023-10-28 ENCOUNTER — Encounter: Payer: Self-pay | Admitting: Nurse Practitioner

## 2023-10-28 ENCOUNTER — Ambulatory Visit (INDEPENDENT_AMBULATORY_CARE_PROVIDER_SITE_OTHER): Payer: Self-pay | Admitting: Nurse Practitioner

## 2023-10-28 VITALS — BP 150/84 | HR 64 | Temp 97.9°F | Ht 60.0 in | Wt 128.2 lb

## 2023-10-28 DIAGNOSIS — I10 Essential (primary) hypertension: Secondary | ICD-10-CM | POA: Insufficient documentation

## 2023-10-28 DIAGNOSIS — R030 Elevated blood-pressure reading, without diagnosis of hypertension: Secondary | ICD-10-CM | POA: Insufficient documentation

## 2023-10-28 LAB — COMPREHENSIVE METABOLIC PANEL
ALT: 14 U/L (ref 0–35)
AST: 14 U/L (ref 0–37)
Albumin: 4.2 g/dL (ref 3.5–5.2)
Alkaline Phosphatase: 44 U/L (ref 39–117)
BUN: 10 mg/dL (ref 6–23)
CO2: 29 meq/L (ref 19–32)
Calcium: 9 mg/dL (ref 8.4–10.5)
Chloride: 104 meq/L (ref 96–112)
Creatinine, Ser: 0.61 mg/dL (ref 0.40–1.20)
GFR: 113.51 mL/min (ref >60.00)
Glucose, Bld: 80 mg/dL (ref 70–99)
Potassium: 3.9 meq/L (ref 3.5–5.1)
Sodium: 139 meq/L (ref 135–145)
Total Bilirubin: 0.5 mg/dL (ref 0.2–1.2)
Total Protein: 6.7 g/dL (ref 6.0–8.3)

## 2023-10-28 LAB — CBC
HCT: 33.7 % — ABNORMAL LOW (ref 36.0–46.0)
Hemoglobin: 11.3 g/dL — ABNORMAL LOW (ref 12.0–15.0)
MCHC: 33.7 g/dL (ref 30.0–36.0)
MCV: 93.9 fl (ref 78.0–100.0)
Platelets: 284 10*3/uL (ref 150.0–400.0)
RBC: 3.59 Mil/uL — ABNORMAL LOW (ref 3.87–5.11)
RDW: 13.2 % (ref 11.5–15.5)
WBC: 6.2 10*3/uL (ref 4.0–10.5)

## 2023-10-28 LAB — TSH: TSH: 0.74 u[IU]/mL (ref 0.35–5.50)

## 2023-10-28 LAB — HCG, QUANTITATIVE, PREGNANCY: Quantitative HCG: <0.6 m[IU]/mL

## 2023-10-28 MED ORDER — NIFEDIPINE ER OSMOTIC RELEASE 30 MG PO TB24: 30.0000 mg | ORAL_TABLET | Freq: Every day | ORAL | 5 refills | Status: DC

## 2023-10-28 NOTE — Patient Instructions (Addendum)
 Go to lab Maintain low salt diet. Avoid ALCOHOL consumption. Maintain adequate oral hydration with water-60-64oz daily Bring BP machine to next appointment. Start procardia 30mg  daily  Hypertension, Adult High blood pressure (hypertension) is when the force of blood pumping through the arteries is too strong. The arteries are the blood vessels that carry blood from the heart throughout the body. Hypertension forces the heart to work harder to pump blood and may cause arteries to become narrow or stiff. Untreated or uncontrolled hypertension can lead to a heart attack, heart failure, a stroke, kidney disease, and other problems. A blood pressure reading consists of a higher number over a lower number. Ideally, your blood pressure should be below 120/80. The first ("top") number is called the systolic pressure. It is a measure of the pressure in your arteries as your heart beats. The second ("bottom") number is called the diastolic pressure. It is a measure of the pressure in your arteries as the heart relaxes. What are the causes? The exact cause of this condition is not known. There are some conditions that result in high blood pressure. What increases the risk? Certain factors may make you more likely to develop high blood pressure. Some of these risk factors are under your control, including: Smoking. Not getting enough exercise or physical activity. Being overweight. Having too much fat, sugar, calories, or salt (sodium) in your diet. Drinking too much alcohol. Other risk factors include: Having a personal history of heart disease, diabetes, high cholesterol, or kidney disease. Stress. Having a family history of high blood pressure and high cholesterol. Having obstructive sleep apnea. Age. The risk increases with age. What are the signs or symptoms? High blood pressure may not cause symptoms. Very high blood pressure (hypertensive crisis) may cause: Headache. Fast or irregular  heartbeats (palpitations). Shortness of breath. Nosebleed. Nausea and vomiting. Vision changes. Severe chest pain, dizziness, and seizures. How is this diagnosed? This condition is diagnosed by measuring your blood pressure while you are seated, with your arm resting on a flat surface, your legs uncrossed, and your feet flat on the floor. The cuff of the blood pressure monitor will be placed directly against the skin of your upper arm at the level of your heart. Blood pressure should be measured at least twice using the same arm. Certain conditions can cause a difference in blood pressure between your right and left arms. If you have a high blood pressure reading during one visit or you have normal blood pressure with other risk factors, you may be asked to: Return on a different day to have your blood pressure checked again. Monitor your blood pressure at home for 1 week or longer. If you are diagnosed with hypertension, you may have other blood or imaging tests to help your health care provider understand your overall risk for other conditions. How is this treated? This condition is treated by making healthy lifestyle changes, such as eating healthy foods, exercising more, and reducing your alcohol intake. You may be referred for counseling on a healthy diet and physical activity. Your health care provider may prescribe medicine if lifestyle changes are not enough to get your blood pressure under control and if: Your systolic blood pressure is above 130. Your diastolic blood pressure is above 80. Your personal target blood pressure may vary depending on your medical conditions, your age, and other factors. Follow these instructions at home: Eating and drinking  Eat a diet that is high in fiber and potassium, and low in sodium,  added sugar, and fat. An example of this eating plan is called the DASH diet. DASH stands for Dietary Approaches to Stop Hypertension. To eat this way: Eat plenty of  fresh fruits and vegetables. Try to fill one half of your plate at each meal with fruits and vegetables. Eat whole grains, such as whole-wheat pasta, brown rice, or whole-grain bread. Fill about one fourth of your plate with whole grains. Eat or drink low-fat dairy products, such as skim milk or low-fat yogurt. Avoid fatty cuts of meat, processed or cured meats, and poultry with skin. Fill about one fourth of your plate with lean proteins, such as fish, chicken without skin, beans, eggs, or tofu. Avoid pre-made and processed foods. These tend to be higher in sodium, added sugar, and fat. Reduce your daily sodium intake. Many people with hypertension should eat less than 1,500 mg of sodium a day. Do not drink alcohol if: Your health care provider tells you not to drink. You are pregnant, may be pregnant, or are planning to become pregnant. If you drink alcohol: Limit how much you have to: 0-1 drink a day for women. 0-2 drinks a day for men. Know how much alcohol is in your drink. In the [place] one drink equals one 12 oz bottle of beer (355 mL), one 5 oz glass of wine (148 mL), or one 1 oz glass of hard liquor (44 mL). Lifestyle  Work with your health care provider to maintain a healthy body weight or to lose weight. Ask what an ideal weight is for you. Get at least 30 minutes of exercise that causes your heart to beat faster (aerobic exercise) most days of the week. Activities may include walking, swimming, or biking. Include exercise to strengthen your muscles (resistance exercise), such as Pilates or lifting weights, as part of your weekly exercise routine. Try to do these types of exercises for 30 minutes at least 3 days a week. Do not use any products that contain nicotine or tobacco. These products include cigarettes, chewing tobacco, and vaping devices, such as e-cigarettes. If you need help quitting, ask your health care provider. Monitor your blood pressure at home as told by your health  care provider. Keep all follow-up visits. This is important. Medicines Take over-the-counter and prescription medicines only as told by your health care provider. Follow directions carefully. Blood pressure medicines must be taken as prescribed. Do not skip doses of blood pressure medicine. Doing this puts you at risk for problems and can make the medicine less effective. Ask your health care provider about side effects or reactions to medicines that you should watch for. Contact a health care provider if you: Think you are having a reaction to a medicine you are taking. Have headaches that keep coming back (recurring). Feel dizzy. Have swelling in your ankles. Have trouble with your vision. Get help right away if you: Develop a severe headache or confusion. Have unusual weakness or numbness. Feel faint. Have severe pain in your chest or abdomen. Vomit repeatedly. Have trouble breathing. These symptoms may be an emergency. Get help right away. Call 911. Do not wait to see if the symptoms will go away. Do not drive yourself to the hospital. Summary Hypertension is when the force of blood pumping through your arteries is too strong. If this condition is not controlled, it may put you at risk for serious complications. Your personal target blood pressure may vary depending on your medical conditions, your age, and other factors. For most people,  a normal blood pressure is less than 120/80. Hypertension is treated with lifestyle changes, medicines, or a combination of both. Lifestyle changes include losing weight, eating a healthy, low-sodium diet, exercising more, and limiting alcohol. This information is not intended to replace advice given to you by your health care provider. Make sure you discuss any questions you have with your health care provider. Document Revised: 06/03/2021 Document Reviewed: 06/03/2021 Elsevier Patient Education  2024 [employer] Inc[employer]  DASH Eating Plan DASH stands  for Dietary Approaches to Stop Hypertension. The DASH eating plan is a healthy eating plan that has been shown to: Lower high blood pressure (hypertension). Reduce your risk for type 2 diabetes, heart disease, and stroke. Help with weight loss. What are tips for following this plan? Reading food labels Check food labels for the amount of salt (sodium) per serving. Choose foods with less than 5 percent of the Daily Value (DV) of sodium. In general, foods with less than 300 milligrams (mg) of sodium per serving fit into this eating plan. To find whole grains, look for the word "whole" as the first word in the ingredient list. Shopping Buy products labeled as "low-sodium" or "no salt added." Buy fresh foods. Avoid canned foods and pre-made or frozen meals. Cooking Try not to add salt when you cook. Use salt-free seasonings or herbs instead of table salt or sea salt. Check with your health care provider or pharmacist before using salt substitutes. Do not fry foods. Cook foods in healthy ways, such as baking, boiling, grilling, roasting, or broiling. Cook using oils that are good for your heart. These include olive, canola, avocado, soybean, and sunflower oil. Meal planning  Eat a balanced diet. This should include: 4 or more servings of fruits and 4 or more servings of vegetables each day. Try to fill half of your plate with fruits and vegetables. 6-8 servings of whole grains each day. 6 or less servings of lean meat, poultry, or fish each day. 1 oz is 1 serving. A 3 oz (85 g) serving of meat is about the same size as the palm of your hand. One egg is 1 oz (28 g). 2-3 servings of low-fat dairy each day. One serving is 1 cup (237 mL). 1 serving of nuts, seeds, or beans 5 times each week. 2-3 servings of heart-healthy fats. Healthy fats called omega-3 fatty acids are found in foods such as walnuts, flaxseeds, fortified milks, and eggs. These fats are also found in cold-water fish, such as sardines,  salmon, and mackerel. Limit how much you eat of: Canned or prepackaged foods. Food that is high in trans fat, such as fried foods. Food that is high in saturated fat, such as fatty meat. Desserts and other sweets, sugary drinks, and other foods with added sugar. Full-fat dairy products. Do not salt foods before eating. Do not eat more than 4 egg yolks a week. Try to eat at least 2 vegetarian meals a week. Eat more home-cooked food and less restaurant, buffet, and fast food. Lifestyle When eating at a restaurant, ask if your food can be made with less salt or no salt. If you drink alcohol: Limit how much you have to: 0-1 drink a day if you are female. 0-2 drinks a day if you are female. Know how much alcohol is in your drink. In the [place] one drink is one 12 oz bottle of beer (355 mL), one 5 oz glass of wine (148 mL), or one 1 oz glass of hard liquor (44  mL). General information Avoid eating more than 2,300 mg of salt a day. If you have hypertension, you may need to reduce your sodium intake to 1,500 mg a day. Work with your provider to stay at a healthy body weight or lose weight. Ask what the best weight range is for you. On most days of the week, get at least 30 minutes of exercise that causes your heart to beat faster. This may include walking, swimming, or biking. Work with your provider or dietitian to adjust your eating plan to meet your specific calorie needs. What foods should I eat? Fruits All fresh, dried, or frozen fruit. Canned fruits that are in their natural juice and do not have sugar added to them. Vegetables Fresh or frozen vegetables that are raw, steamed, roasted, or grilled. Low-sodium or reduced-sodium tomato and vegetable juice. Low-sodium or reduced-sodium tomato sauce and tomato paste. Low-sodium or reduced-sodium canned vegetables. Grains Whole-grain or whole-wheat bread. Whole-grain or whole-wheat pasta. Brown rice. Orpah Cobb. Bulgur. Whole-grain and  low-sodium cereals. Pita bread. Low-fat, low-sodium crackers. Whole-wheat flour tortillas. Meats and other proteins Skinless chicken or [place]. Ground chicken or [place]. Pork with fat trimmed off. Fish and seafood. Egg whites. Dried beans, peas, or lentils. Unsalted nuts, nut butters, and seeds. Unsalted canned beans. Lean cuts of beef with fat trimmed off. Low-sodium, lean precooked or cured meat, such as sausages or meat loaves. Dairy Low-fat (1%) or fat-free (skim) milk. Reduced-fat, low-fat, or fat-free cheeses. Nonfat, low-sodium ricotta or cottage cheese. Low-fat or nonfat yogurt. Low-fat, low-sodium cheese. Fats and oils Soft margarine without trans fats. Vegetable oil. Reduced-fat, low-fat, or light mayonnaise and salad dressings (reduced-sodium). Canola, safflower, olive, avocado, soybean, and sunflower oils. Avocado. Seasonings and condiments Herbs. Spices. Seasoning mixes without salt. Other foods Unsalted popcorn and pretzels. Fat-free sweets. The items listed above may not be all the foods and drinks you can have. Talk to a dietitian to learn more. What foods should I avoid? Fruits Canned fruit in a light or heavy syrup. Fried fruit. Fruit in cream or butter sauce. Vegetables Creamed or fried vegetables. Vegetables in a cheese sauce. Regular canned vegetables that are not marked as low-sodium or reduced-sodium. Regular canned tomato sauce and paste that are not marked as low-sodium or reduced-sodium. Regular tomato and vegetable juices that are not marked as low-sodium or reduced-sodium. Rosita Fire. Olives. Grains Baked goods made with fat, such as croissants, muffins, or some breads. Dry pasta or rice meal packs. Meats and other proteins Fatty cuts of meat. Ribs. Fried meat. Tomasa Blase. Bologna, salami, and other precooked or cured meats, such as sausages or meat loaves, that are not lean and low in sodium. Fat from the back of a pig (fatback). Bratwurst. Salted nuts and seeds. Canned beans  with added salt. Canned or smoked fish. Whole eggs or egg yolks. Chicken or [place] with skin. Dairy Whole or 2% milk, cream, and half-and-half. Whole or full-fat cream cheese. Whole-fat or sweetened yogurt. Full-fat cheese. Nondairy creamers. Whipped toppings. Processed cheese and cheese spreads. Fats and oils Butter. Stick margarine. Lard. Shortening. Ghee. Bacon fat. Tropical oils, such as coconut, palm kernel, or palm oil. Seasonings and condiments Onion salt, garlic salt, seasoned salt, table salt, and sea salt. Worcestershire sauce. Tartar sauce. Barbecue sauce. Teriyaki sauce. Soy sauce, including reduced-sodium soy sauce. Steak sauce. Canned and packaged gravies. Fish sauce. Oyster sauce. Cocktail sauce. Store-bought horseradish. Ketchup. Mustard. Meat flavorings and tenderizers. Bouillon cubes. Hot sauces. Pre-made or packaged marinades. Pre-made or packaged taco seasonings.  Relishes. Regular salad dressings. Other foods Salted popcorn and pretzels. The items listed above may not be all the foods and drinks you should avoid. Talk to a dietitian to learn more. Where to find more information National Heart, Lung, and Blood Institute (NHLBI): BuffaloDryCleaner.gl American Heart Association (AHA): heart.org Academy of Nutrition and Dietetics: eatright.org National Kidney Foundation (NKF): kidney.org This information is not intended to replace advice given to you by your health care provider. Make sure you discuss any questions you have with your health care provider. Document Revised: 08/13/2022 Document Reviewed: 08/13/2022 Elsevier Patient Education  2024 [employer].

## 2023-10-28 NOTE — Assessment & Plan Note (Addendum)
 Random Bp check at home 2days ago: 185/80, Asymptomatic Denies any hx of OBSTRUCTIVE SLEEP APNEA or witnessed apnea No tobacco use, intermittent binge ALCOHOL consumption-1/2bottle of liquor 1-2x/months No Fhx of HYPERTENSION Sexually active, no contraception, no tobacco or illicit drug use BP Readings from Last 3 Encounters:  10/28/23 (!) 150/84  07/15/21 138/74  07/08/21 138/90    ECG: S. Bradycardia, no ST segment or T wave abnormality, no previous ECG to compare. Check THYROID, CBC, CMP, and HCG Advised about importance of DASH diet, avoid ALCOHOL consumption, and need to prevent pregnancy with use of condoms. Avoid labetalol due to low HR Start procardia 30mg  XL daily F/up in 58month

## 2023-10-28 NOTE — Progress Notes (Signed)
 Established Patient Visit  Patient: Natalie Oconnor   DOB: 01/16/1986   38 y.o. Female  MRN: [id number] Visit Date: 10/28/2023  Subjective:    Chief Complaint  Patient presents with   Transitions Of Care    Transfer of care patient is concerned about HTN   HPI Transfer from [facility], last OV 2022  Elevated BP without diagnosis of hypertension Random Bp check at home 2days ago: 185/80, Asymptomatic Denies any hx of OBSTRUCTIVE SLEEP APNEA or witnessed apnea No tobacco use, intermittent binge ALCOHOL consumption-1/2bottle of liquor 1-2x/months No Fhx of HYPERTENSION Sexually active, no contraception, no tobacco or illicit drug use BP Readings from Last 3 Encounters:  10/28/23 (!) 150/84  07/15/21 138/74  07/08/21 138/90    ECG: S. Bradycardia, no ST segment or T wave abnormality, no previous ECG to compare. Check THYROID, CBC, CMP, and HCG Advised about importance of DASH diet, avoid ALCOHOL consumption, and need to prevent pregnancy with use of condoms. Avoid labetalol due to low HR Start procardia 30mg  XL daily F/up in 45month      10/28/2023    8:58 AM 06/16/2021    4:02 PM 04/25/2018    9:49 AM  Depression screen PHQ 2/9  Decreased Interest 2 0 0  Down, Depressed, Hopeless 0 0 0  PHQ - 2 Score 2 0 0  Altered sleeping 0    Tired, decreased energy 1    Change in appetite 0    Feeling bad or failure about yourself  0    Trouble concentrating 1    Moving slowly or fidgety/restless 0    Suicidal thoughts 0    PHQ-9 Score 4    Difficult doing work/chores Not difficult at all         10/28/2023    9:02 AM  GAD 7 : Generalized Anxiety Score  Nervous, Anxious, on Edge 2  Control/stop worrying 1  Worry too much - different things 1  Trouble relaxing 1  Restless 2  Easily annoyed or irritable 1  Afraid - awful might happen 1  Total GAD 7 Score 9  Anxiety Difficulty Not difficult at all   SDOH Screenings   Food Insecurity: No Food Insecurity (10/28/2023)   Housing: Low Risk  (10/28/2023)  Transportation Needs: No Transportation Needs (10/28/2023)  Utilities: Not At Risk (10/28/2023)  Alcohol Screen: Low Risk  (10/28/2023)  Depression (PHQ2-9): Low Risk  (10/28/2023)  Financial Resource Strain: Low Risk  (10/28/2023)  Physical Activity: Inactive (10/28/2023)  Social Connections: Socially Isolated (10/28/2023)  Stress: Stress Concern Present (10/28/2023)  Tobacco Use: Low Risk  (10/28/2023)  Health Literacy: Adequate Health Literacy (10/28/2023)    Reviewed medical, surgical, and social history today  Medications: Outpatient Medications Prior to Visit  Medication Sig   [DISCONTINUED] amLODipine (NORVASC) 5 MG tablet Take 1 tablet (5 mg total) by mouth daily.   [DISCONTINUED] ciprofloxacin (CIPRO) 500 MG tablet Take 1 tablet (500 mg total) by mouth 2 (two) times daily. (Patient not taking: Reported on 07/15/2021)   [DISCONTINUED] losartan (COZAAR) 50 MG tablet Take 1 tablet (50 mg total) by mouth daily.   No facility-administered medications prior to visit.   Reviewed past medical and social history.   ROS per HPI above      Objective:  BP (!) 150/84 (BP Location: Left Arm, Patient Position: Sitting, Cuff Size: Normal)   Pulse 64   Temp 97.9 F (36.6 C) (Temporal)  Ht 5' (1.524 m)   Wt 128 lb 3.2 oz (58.2 kg)   LMP 10/25/2023 (Exact Date)   SpO2 98%   Breastfeeding No   BMI 25.04 kg/m      Physical Exam Vitals and nursing note reviewed.  Neck:     Thyroid: No thyroid mass, thyromegaly or thyroid tenderness.  Cardiovascular:     Rate and Rhythm: Normal rate and regular rhythm.     Pulses: Normal pulses.     Heart sounds: Normal heart sounds.  Pulmonary:     Effort: Pulmonary effort is normal.     Breath sounds: Normal breath sounds.  Musculoskeletal:     Cervical back: Normal range of motion and neck supple.     Right lower leg: No edema.     Left lower leg: No edema.  Neurological:     Mental Status: She is alert and  oriented to person, place, and time.     No results found for any visits on 10/28/23.    Assessment & Plan:    Problem List Items Addressed This Visit     Elevated BP without diagnosis of hypertension - Primary   Random Bp check at home 2days ago: 185/80, Asymptomatic Denies any hx of OBSTRUCTIVE SLEEP APNEA or witnessed apnea No tobacco use, intermittent binge ALCOHOL consumption-1/2bottle of liquor 1-2x/months No Fhx of HYPERTENSION Sexually active, no contraception, no tobacco or illicit drug use BP Readings from Last 3 Encounters:  10/28/23 (!) 150/84  07/15/21 138/74  07/08/21 138/90    ECG: S. Bradycardia, no ST segment or T wave abnormality, no previous ECG to compare. Check THYROID, CBC, CMP, and HCG Advised about importance of DASH diet, avoid ALCOHOL consumption, and need to prevent pregnancy with use of condoms. Avoid labetalol due to low HR Start procardia 30mg  XL daily F/up in 91month       Relevant Medications   NIFEdipine (PROCARDIA XL) 30 MG 24 hr tablet   Return in about 4 weeks (around 11/25/2023) for HTN.     Alysia Penna, NP

## 2023-11-02 ENCOUNTER — Telehealth: Payer: Self-pay

## 2023-11-02 DIAGNOSIS — R030 Elevated blood-pressure reading, without diagnosis of hypertension: Secondary | ICD-10-CM

## 2023-11-02 DIAGNOSIS — I10 Essential (primary) hypertension: Secondary | ICD-10-CM

## 2023-11-02 NOTE — Telephone Encounter (Signed)
 Copied from CRM [id number]. Topic: Clinical - Medication Question >> Nov 02, 2023 11:45 AM Adaysia C wrote: Reason for CRM: Patient called to inform the provider the GE:[id number] (PROCARDIA XL) 30 MG 24 hr tablet that she just started taking is making her tired; patient has requested a call back from the provider #[phone]

## 2023-11-02 NOTE — Telephone Encounter (Signed)
 Left a voice message asking to give me a call back at [phone]. I will try calling again.

## 2023-11-02 NOTE — Telephone Encounter (Signed)
 Forwarding message below. LOV 10/28/2023

## 2023-11-03 NOTE — Telephone Encounter (Signed)
 Spoke to Pt and she been experiencing a fast heart rate and fatigue after taking Procardia XL 30MG . Pt BP reading ranges are 153/98 and 147/81 in which she takes at night. Please advise. LOV was 10/28/2023

## 2023-11-03 NOTE — Telephone Encounter (Signed)
 Copied from CRM [id number]. Topic: Clinical - Prescription Issue >> Nov 03, 2023  2:20 PM Eunice Blase wrote: Reason for CRM: Pt called stated NIFEdipine (PROCARDIA XL) 30 MG 24 hr tablet makes her feel heart beating faster, feel tired. Please call pt at [phone]

## 2023-11-05 MED ORDER — LOSARTAN POTASSIUM 25 MG PO TABS: 25.0000 mg | ORAL_TABLET | Freq: Every day | ORAL | 5 refills | Status: DC

## 2023-11-05 NOTE — Addendum Note (Signed)
 Addended by: Michaela Corner on: 11/05/2023 03:58 PM   Modules accepted: Orders

## 2023-11-05 NOTE — Assessment & Plan Note (Signed)
 Unable to tolerate procardia: fatigue and tachycardia Switched to losartan advised about pregnancy contraindication. Advised to use condoms at all times

## 2023-11-09 NOTE — Telephone Encounter (Signed)
 Called and and could not leave a voice message due to [facility] not on file. Will try calling again

## 2023-11-10 ENCOUNTER — Ambulatory Visit: Payer: Self-pay

## 2023-11-10 ENCOUNTER — Ambulatory Visit: Payer: Self-pay | Admitting: *Deleted

## 2023-11-10 NOTE — Telephone Encounter (Signed)
 Opened chart to answer question for agent.   Pt . Has been triaged and notes routed to practice by another triage nurse earlier today.   Had agent transfer to CAL line since pt already triaged and she was not calling with new symptoms.

## 2023-11-10 NOTE — Telephone Encounter (Signed)
  Chief Complaint: Blood Pressure Fluctuations Symptoms: Lightheadedness, Headache  Frequency: Acute  Pertinent Negatives: Patient denies chest pain, dyspnea, or neurological deficit.  Disposition: [] ED /[] Urgent Care (no appt availability in office) / [x] Appointment(In office/virtual)/ []  [facility]/ [] Home Care/ [] Refused Recommended Disposition /[] [facility]/ []  Follow-up with PCP Additional Notes: PB is being triaged for blood pressure fluctuations. The patient is not currently symptomatic, but has experienced some symptoms such as a headache and lightheadedness over the course of noticed fluctuations. In offce appointment made for next week.   Reason for Disposition  [1] Systolic BP  >= 130 OR Diastolic >= 80 AND [2] taking BP medications  Answer Assessment - Initial Assessment Questions 1. BLOOD PRESSURE: "What is the blood pressure?" "Did you take at least two measurements 5 minutes apart?"     No higher than 140's Systolic 2. ONSET: "When did you take your blood pressure?"     Last Night  3. HOW: "How did you take your blood pressure?" (e.g., automatic home BP monitor, visiting nurse)     Automatic Blood Pressure Cuff  4. HISTORY: "Do you have a history of high blood pressure?"     Yes  5. MEDICINES: "Are you taking any medicines for blood pressure?" "Have you missed any doses recently?"     Yes  6. OTHER SYMPTOMS: "Do you have any symptoms?" (e.g., blurred vision, chest pain, difficulty breathing, headache, weakness)     Lightheaded, Headache 7. PREGNANCY: "Is there any chance you are pregnant?" "When was your last menstrual period?"     No, LMP 2 Weeks ago  Protocols used: Blood Pressure - High-A-AH

## 2023-11-10 NOTE — Telephone Encounter (Signed)
 Patient returned my call. I informed her of why I was calling. I informed patient of Charlotte's comments of stopping the procardia and starting Losartan 25 mg daily. She stated that she has started that and wanted to know if she can have the birth control pills. I informed her that I will be passing her question to Bonnie. I also asked if it would be best to send her a MyChart message since when calling it goes to voicemail. She stated no to just call her and she will call back.

## 2023-11-10 NOTE — Telephone Encounter (Signed)
 Patient called, left VM to return the call to the office to speak to NT.    Copied from CRM [id number]. Topic: Clinical - Medication Question >> Nov 10, 2023  9:51 AM Natalie Oconnor wrote: Reason for CRM: Patient calling regarding the losartan and is wondering if caffeine is okay to drink while on medication. She was also reporting fluctuating blood pressure.

## 2023-11-11 NOTE — Telephone Encounter (Signed)
 Called and could not leave a voice message due to DPR is not on file. I will try calling patient again.

## 2023-11-11 NOTE — Telephone Encounter (Signed)
 Patient returned my call and I informed her of Charlotte's comments about the birth control. Patient is okay with keeping 11/18/23 appointment to discuss at that time.

## 2023-11-18 ENCOUNTER — Ambulatory Visit: Payer: Self-pay | Admitting: Nurse Practitioner

## 2023-11-19 ENCOUNTER — Ambulatory Visit: Payer: Self-pay | Admitting: Family Medicine

## 2023-11-25 ENCOUNTER — Ambulatory Visit (INDEPENDENT_AMBULATORY_CARE_PROVIDER_SITE_OTHER): Payer: Self-pay | Admitting: Nurse Practitioner

## 2023-11-25 ENCOUNTER — Encounter: Payer: Self-pay | Admitting: Nurse Practitioner

## 2023-11-25 VITALS — BP 128/76 | HR 64 | Temp 98.1°F | Ht 60.0 in | Wt 127.8 lb

## 2023-11-25 DIAGNOSIS — I10 Essential (primary) hypertension: Secondary | ICD-10-CM

## 2023-11-25 DIAGNOSIS — Z3009 Encounter for other general counseling and advice on contraception: Secondary | ICD-10-CM

## 2023-11-25 LAB — POCT URINE PREGNANCY: Preg Test, Ur: NEGATIVE

## 2023-11-25 MED ORDER — NORETHIN ACE-ETH ESTRAD-FE 1-20 MG-MCG PO TABS: 1.0000 | ORAL_TABLET | Freq: Every day | ORAL | 3 refills | Status: DC

## 2023-11-25 MED ORDER — LOSARTAN POTASSIUM 25 MG PO TABS: 25.0000 mg | ORAL_TABLET | Freq: Every day | ORAL | 1 refills | Status: DC

## 2023-11-25 NOTE — Patient Instructions (Addendum)
 Maintain current med dose and low salt diet Monitor BP daily in AM only Start Junel of 1st day of next menstrual cycle.  Norethindrone Acetate; Ethinyl Estradiol; Ferrous Fumarate Capsules or Tablets What is this medication? NORETHINDRONE ACETATE; ETHINYL ESTRADIOL; FERROUS FUMARATE (nor eth IN drone AS e tate; ETH in il es tra DYE ole; FER [place] FUE ma rate) prevents ovulation and pregnancy. It may also be used to treat acne. It belongs to a group of medications called oral contraceptives. It is a combination of the hormones estrogen and progestin. This medicine may be used for other purposes; ask your health care provider or pharmacist if you have questions. COMMON BRAND NAME(S): Aurovela [id number] Fe 1/20, Aurovela Fe, Bilsovi Fe 1.5/30, Bilsovi Fe 1/20, Blisovi [address] [place], [place], [address], [address] Fe 1.5/30, G[place] Fe 1/20, Hailey 24 Fe, Hailey Fe 1.5/30, Junel Fe 1.5/30, Junel Fe 1/20, Junel Fe 24, Larin Fe, Lo Loestrin Fe, Loestrin 24 Fe, Loestrin FE 1.5/30, Loestrin FE 1/20, Lomedia 24 Fe, [place], Microgestin 24 Fe, Microgestin Fe, Microgestin Fe 1.5/30, Microgestin Fe 1/20, Tarina 24 Fe, Tarina Fe 1/20, Taysofy, Taytulla, Tilia Fe, Tri-Legest Fe What should I tell my care team before I take this medication? They need to know if you have any of these conditions: Abnormal vaginal bleeding Blood vessel disease or blood clots Cancer, such as breast, cervical, endometrial, ovarian, liver, or uterine cancer Diabetes Gallbladder disease Having surgery Heart disease or recent heart attack High blood pressure High cholesterol or triglycerides History of irregular heartbeat or heart valve problems Kidney disease Liver disease Lupus Migraine headaches Protein C or S deficiency Recently had a baby, miscarriage, or abortion Stroke Tobacco use An unusual or allergic reaction to estrogens, progestins, other medications, foods, dyes, or preservatives Pregnant or trying to get  pregnant Breastfeeding How should I use this medication? Take this medication by mouth. Take it as directed on the prescription label at the same time every day and in the order directed on the package. To reduce nausea, this medication may be taken with food. A patient package insert for the product will be given with each prescription and refill. Be sure to read this information carefully each time. The sheet may change often. Contact your care team about the use of this medication in children. Special care may be needed. Overdosage: If you think you have taken too much of this medicine contact a poison control center or emergency room at once. NOTE: This medicine is only for you. Do not share this medicine with others. What if I miss a dose? If you miss a dose, refer to the patient information sheet you received with your medication for direction. This medication may not work as well if you miss more than one pill. You may need to use a back-up contraceptive. What may interact with this medication? Do not take this medication with the following: Dasabuvir; ombitasvir; paritaprevir; ritonavir Ombitasvir; paritaprevir; ritonavir This medication may also interact with the following: Acetaminophen Aprepitant Ascorbic acid (vitamin C) Atorvastatin Barbiturate medications, such as phenobarbital Bosentan Carbamazepine Caffeine Certain antibiotics, such as rifampin, rifabutin, rifapentine, griseofulvin, penicillins, or tetracyclines Certain antivirals for HIV, such as ritonavir Clofibrate Cyclosporine Dantrolene Doxercalciferol Felbamate Grapefruit juice Hydrocortisone Medications for anxiety or sleeping problems, such as diazepam or temazepam Medications for diabetes, including pioglitazone Mineral oil Modafinil Mycophenolate Nefazodone Oxcarbazepine Phenytoin Prednisolone Rosuvastatin Selegiline Soy isoflavones supplements St. John's wort Tamoxifen or  raloxifene Theophylline Thyroid hormones Topiramate Warfarin This list may not  describe all possible interactions. Give your health care provider a list of all the medicines, herbs, non-prescription drugs, or dietary supplements you use. Also tell them if you smoke, drink alcohol, or use illegal drugs. Some items may interact with your medicine. What should I watch for while using this medication? Visit your care team for regular checks on your progress. You will need regular breast and pelvic exams and a Pap smear while on this medication. Use an additional method of contraception during the first cycle that you take this medication. If you may be pregnant, stop taking this medication right away and contact your care team. If you are taking this medication for hormone related problems, it may take several cycles of use to see improvement in your condition. Talk to your care team if you use tobacco products. Changes to your treatment plan may be needed. Tobacco increases the risk of getting a blood clot or having a stroke while you are taking this medication. The risk is higher if you are 35 years or older. This medication can make your body retain fluid, making your fingers, hands, or ankles swell. Your blood pressure can go up. Contact your care team if you feel you are retaining fluid. This medication can make you more sensitive to the sun. Keep out of the sun. If you cannot avoid being in the sun, wear protective clothing and sunscreen. Do not use sun lamps, tanning beds, or tanning booths. If you wear contact lenses and notice visual changes, or if the lenses begin to feel uncomfortable, consult your eye care specialist. Tenderness, swelling, or minor bleeding of the gums may occur. Talk to your dentist if this happens. Brushing and flossing your teeth regularly may reduce the risk of side effects. Visit your dentist on a regular basis. Tell your dentist about any medications you are taking. If  you are going to have elective surgery, you may need to stop taking this medication before the surgery. Consult your care team for advice. Using this medication does not protect you or your partner against HIV or other sexually transmitted infections (STIs). What side effects may I notice from receiving this medication? Side effects that you should report to your care team as soon as possible: Allergic reactions--skin rash, itching, hives, swelling of the face, lips, tongue, or throat Blood clot--pain, swelling, or warmth in the leg, shortness of breath, chest pain Gallbladder problems--severe stomach pain, nausea, vomiting, fever Increase in blood pressure Liver injury--right upper belly pain, loss of appetite, light-colored stool, dark yellow or brown urine, yellowing skin or eyes, unusual weakness or fatigue New or worsening migraines or headaches Stroke--sudden numbness or weakness of the face, arm or leg, trouble speaking, confusion, trouble walking, loss of balance or coordination, dizziness, severe headache, change in vision Unusual vaginal discharge, itching, or odor Worsening mood, feelings of depression Side effects that usually do not require medical attention (report these to your care team if they continue or are bothersome): Breast pain or tenderness Dark patches of skin on the face or other sun-exposed areas Irregular menstrual cycles or spotting Nausea Weight gain This list may not describe all possible side effects. Call your doctor for medical advice about side effects. You may report side effects to FDA at 1-800-FDA-1088. Where should I keep my medication? Keep out of the reach of children and pets. Store at room temperature between 15 and 30 degrees C (59 and 86 degrees F). Throw away any unused medication after the expiration date. NOTE: This  sheet is a summary. It may not cover all possible information. If you have questions about this medicine, talk to your doctor,  pharmacist, or health care provider.  2024 Elsevier/Gold Standard (2022-12-16 00:00:00)

## 2023-11-25 NOTE — Progress Notes (Signed)
                Established Patient Visit  Patient: Natalie Oconnor   DOB: 28-Oct-1985   38 y.o. Female  MRN: [id number] Visit Date: 11/25/2023  Subjective:    Chief Complaint  Patient presents with   Follow-up    4 week follow-up for Hypertension. Pictures of bp on phone.   HPI HTN (hypertension) Unable to tolerate procardia: tachycardia and fatigue. Switched to losartan 25mg  Home BP reading:114/69, 137/83, 108/67 report HA with elevated BP: 137/83 No adverse effect with losartan Has maintained low salt diet BP Readings from Last 3 Encounters:  11/25/23 128/76  10/28/23 (!) 150/84  07/15/21 138/74    Maintain med dose and DASh diet F/up in 3months    BP Readings from Last 3 Encounters:  11/25/23 128/76  10/28/23 (!) 150/84  07/15/21 138/74    Reviewed medical, surgical, and social history today  Medications: Outpatient Medications Prior to Visit  Medication Sig   [DISCONTINUED] losartan (COZAAR) 25 MG tablet Take 1 tablet (25 mg total) by mouth daily.   No facility-administered medications prior to visit.   Reviewed past medical and social history.   ROS per HPI above      Objective:  BP 128/76   Pulse 64   Temp 98.1 F (36.7 C) (Temporal)   Ht 5' (1.524 m)   Wt 127 lb 12.8 oz (58 kg)   LMP 10/25/2023 (Exact Date)   SpO2 96%   BMI 24.96 kg/m      Physical Exam Vitals and nursing note reviewed.  Cardiovascular:     Rate and Rhythm: Normal rate and regular rhythm.     Pulses: Normal pulses.     Heart sounds: Normal heart sounds.  Pulmonary:     Effort: Pulmonary effort is normal.     Breath sounds: Normal breath sounds.  Musculoskeletal:     Right lower leg: No edema.     Left lower leg: No edema.  Neurological:     Mental Status: She is alert and oriented to person, place, and time.     Results for orders placed or performed in visit on 11/25/23  POCT urine pregnancy  Result Value Ref Range   Preg Test, Ur Negative Negative      Assessment  & Plan:    Problem List Items Addressed This Visit     HTN (hypertension) - Primary   Unable to tolerate procardia: tachycardia and fatigue. Switched to losartan 25mg  Home BP reading:114/69, 137/83, 108/67 report HA with elevated BP: 137/83 No adverse effect with losartan Has maintained low salt diet BP Readings from Last 3 Encounters:  11/25/23 128/76  10/28/23 (!) 150/84  07/15/21 138/74    Maintain med dose and DASh diet F/up in 3months      Relevant Medications   losartan (COZAAR) 25 MG tablet   Other Visit Diagnoses       Encounter for counseling regarding contraception       Relevant Medications   norethindrone-ethinyl estradiol-FE (JUNEL FE 1/20) 1-20 MG-MCG tablet   Other Relevant Orders   POCT urine pregnancy (Completed)      Return in about 3 months (around 02/24/2024) for HTN and PAP.     Kathrene Parents, NP

## 2023-11-25 NOTE — Assessment & Plan Note (Signed)
 Unable to tolerate procardia: tachycardia and fatigue. Switched to losartan 25mg  Home BP reading:114/69, 137/83, 108/67 report HA with elevated BP: 137/83 No adverse effect with losartan Has maintained low salt diet BP Readings from Last 3 Encounters:  11/25/23 128/76  10/28/23 (!) 150/84  07/15/21 138/74    Maintain med dose and DASh diet F/up in 3months

## 2023-11-25 NOTE — Progress Notes (Signed)
Wilfred Lacy, NP

## 2024-01-18 ENCOUNTER — Ambulatory Visit (INDEPENDENT_AMBULATORY_CARE_PROVIDER_SITE_OTHER): Payer: Self-pay | Admitting: Family Medicine

## 2024-01-18 ENCOUNTER — Encounter: Payer: Self-pay | Admitting: Family Medicine

## 2024-01-18 VITALS — BP 134/78 | HR 58 | Temp 97.0°F | Ht 60.0 in | Wt 129.2 lb

## 2024-01-18 DIAGNOSIS — I10 Essential (primary) hypertension: Secondary | ICD-10-CM

## 2024-01-18 DIAGNOSIS — M542 Cervicalgia: Secondary | ICD-10-CM

## 2024-01-18 MED ORDER — TIZANIDINE HCL 4 MG PO TABS: 4.0000 mg | ORAL_TABLET | Freq: Three times a day (TID) | ORAL | 0 refills | Status: DC | PRN

## 2024-01-18 MED ORDER — KETOROLAC TROMETHAMINE 60 MG/2ML IM SOLN
60.0000 mg | Freq: Once | INTRAMUSCULAR | Status: AC
  Administered 2024-01-18: 60 mg via INTRAMUSCULAR

## 2024-01-18 MED ORDER — CELECOXIB 200 MG PO CAPS: 200.0000 mg | ORAL_CAPSULE | Freq: Two times a day (BID) | ORAL | 0 refills | Status: DC | PRN

## 2024-01-18 NOTE — Progress Notes (Signed)
 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 Differential includes cervical spine issues or rotator cuff pathology, but these are less likely. - Administer Toradol (ketorolac) injection for immediate pain relief. - Prescribe celecoxib 200 mg twice daily as needed for pain, to be taken with food. - Prescribe tizanidine 4 mg up to three times daily as needed for muscle relaxation, cautioning about potential drowsiness. - Order cervical spine X-ray at [facility] site. - Provide home neck and shoulder exercises to relieve discomfort. - Advise use of heat and ice as needed. - Encourage maintaining upright posture, especially at work. - Advise to drink plenty of fluids and take medications with food. - Schedule follow-up in two weeks if symptoms do not improve.  Hypertension Blood pressure measured at 134/78 mmHg, indicating well-controlled hypertension.      There are no discontinued medications.  Return if symptoms worsen or fail to improve.        Subjective:   Encounter date: 01/18/2024  Natalie Oconnor is a 38 y.o. female who has History of asthma and HTN (hypertension) on their problem list..   She  has a past medical history of Asthma, Late prenatal care, No pertinent past medical history, Normal pregnancy (05/08/2011), and SVD (spontaneous vaginal delivery) (05/08/2011)..   She presents with chief complaint of Headache (2 weeks starts at the center of forehead radiates to bilateral side of head light sensitivity ) and Shoulder Pain (2 weeks bilateral shoulder pain pressure feeling radiates to hands left thumb pain worse with movement ) .   Discussed the use of AI scribe software for  clinical note transcription with the patient, who gave verbal consent to proceed.  History of Present Illness Natalie Poche "Loetta Ringer" is a 38 year old female who presents with bilateral shoulder and neck pain.  She has been experiencing bilateral shoulder and neck pain for the past two to three years, with symptoms worsening over the last two months. The pain radiates from the neck down both sides into the arms and is described as a feeling of heaviness rather than sharp pain. It is not constant and lacks specific triggers.  She has been taking Tylenol  for the discomfort, but it has not been effective. No allergies to medications. She inquires if her symptoms could be related to diabetes, although this is not confirmed.  She works as a Advertising account planner and denies any heavy lifting or awkward postures contributing to her symptoms. No weakness in her arms and light touch sensation is intact. She has not experienced any pain when moving her arms or shoulders, although she notes tightness in the upper back and shoulders.     ROS  Past Surgical History:  Procedure Laterality Date   NO PAST SURGERIES      Outpatient Medications Prior to Visit  Medication Sig Dispense Refill   losartan  (COZAAR ) 25 MG tablet Take 1 tablet (25 mg total) by mouth daily. 90 tablet 1   norethindrone-ethinyl estradiol-FE (JUNEL FE 1/20) 1-20 MG-MCG tablet Take 1 tablet by mouth daily. (Patient not taking: Reported on 01/18/2024) 72 tablet 3   No facility-administered medications prior to visit.    Family History  Problem Relation Age of Onset   Asthma Brother  Social History   Socioeconomic History   Marital status: Single    Spouse name: Not on file   Number of children: 4   Years of education: Not on file   Highest education level: 9th grade  Occupational History   Not on file  Tobacco Use   Smoking status: Never    Passive exposure: Never   Smokeless tobacco: Never  Vaping Use   Vaping status:  Never Used  Substance and Sexual Activity   Alcohol use: Yes    Alcohol/week: 10.0 standard drinks of alcohol    Types: 10 Shots of liquor per week   Drug use: No   Sexual activity: Yes  Other Topics Concern   Not on file  Social History Narrative   Not on file   Social Drivers of Health   Financial Resource Strain: Low Risk  (01/17/2024)   Overall Financial Resource Strain (CARDIA)    Difficulty of Paying Living Expenses: Not very hard  Food Insecurity: No Food Insecurity (01/17/2024)   Hunger Vital Sign    Worried About Running Out of Food in the Last Year: Never true    Ran Out of Food in the Last Year: Never true  Transportation Needs: No Transportation Needs (01/17/2024)   PRAPARE - Administrator, Civil Service (Medical): No    Lack of Transportation (Non-Medical): No  Physical Activity: Insufficiently Active (01/17/2024)   Exercise Vital Sign    Days of Exercise per Week: 3 days    Minutes of Exercise per Session: 30 min  Stress: No Stress Concern Present (01/17/2024)   [employer] of Occupational Health - Occupational Stress Questionnaire    Feeling of Stress : Only a little  Recent Concern: Stress - Stress Concern Present (10/28/2023)   [employer] of Occupational Health - Occupational Stress Questionnaire    Feeling of Stress : Very much  Social Connections: Socially Isolated (01/17/2024)   Social Connection and Isolation Panel [NHANES]    Frequency of Communication with Friends and Family: Three times a week    Frequency of Social Gatherings with Friends and Family: Once a week    Attends Religious Services: Never    Database administrator or Organizations: No    Attends Banker Meetings: Never    Marital Status: Never married  Intimate Partner Violence: Not At Risk (10/28/2023)   Humiliation, Afraid, Rape, and Kick questionnaire    Fear of Current or Ex-Partner: No    Emotionally Abused: No    Physically Abused: No    Sexually Abused:  No                                                                                                  Objective:  Physical Exam: BP 134/78 (BP Location: Left Arm, Patient Position: Sitting, Cuff Size: Normal)   Pulse (!) 58   Temp (!) 97 F (36.1 C) (Temporal)   Ht 5' (1.524 m)   Wt 129 lb 3.2 oz (58.6 kg)   SpO2 97%   BMI 25.23 kg/m    Physical Exam VITALS: BP-  134/78 GENERAL: Alert, cooperative, well developed, no acute distress. HEENT: Normocephalic, normal oropharynx, moist mucous membranes. NECK: Neck and trapezius tender on palpation.Spasm in trapezious bilaterally. Strength and range of motion normal. Rotation and Flex/Ext.  CHEST: Clear to auscultation bilaterally, no wheezes, rhonchi, or crackles. CARDIOVASCULAR: Normal heart rate and rhythm, S1 and S2 normal without murmurs. ABDOMEN: Soft, non-tender, non-distended, without organomegaly, normal bowel sounds. EXTREMITIES: No cyanosis or edema.5/5 upper extremity  strength bilateral. Normal Sensation intact to light touch upper extremities. MUSCULOSKELETAL: Shoulder strength and range of motion normal. NEUROLOGICAL: Cranial nerves grossly intact, moves all extremities without gross motor or sensory deficit. Upper extremity motor strength intact. Light touch sensation intact in shoulders.   Physical Exam  No results found.  Recent Results (from the past 2160 hours)  Comprehensive metabolic panel     Status: None   Collection Time: 10/28/23  9:48 AM  Result Value Ref Range   Sodium 139 135 - 145 mEq/L   Potassium 3.9 3.5 - 5.1 mEq/L   Chloride 104 96 - 112 mEq/L   CO2 29 19 - 32 mEq/L   Glucose, Bld 80 70 - 99 mg/dL   BUN 10 6 - 23 mg/dL   Creatinine, Ser 4.09 0.40 - 1.20 mg/dL   Total Bilirubin 0.5 0.2 - 1.2 mg/dL   Alkaline Phosphatase 44 39 - 117 U/L   AST 14 0 - 37 U/L   ALT 14 0 - 35 U/L   Total Protein 6.7 6.0 - 8.3 g/dL   Albumin 4.2 3.5 - 5.2 g/dL   GFR 811.91 >47.82 mL/min    Comment: Calculated using  the CKD-EPI Creatinine Equation (2021)   Calcium 9.0 8.4 - 10.5 mg/dL  CBC     Status: Abnormal   Collection Time: 10/28/23  9:48 AM  Result Value Ref Range   WBC 6.2 4.0 - 10.5 K/uL   RBC 3.59 (L) 3.87 - 5.11 Mil/uL   Platelets 284.0 150.0 - 400.0 K/uL   Hemoglobin 11.3 (L) 12.0 - 15.0 g/dL   HCT 95.6 (L) 21.3 - 08.6 %   MCV 93.9 78.0 - 100.0 fl   MCHC 33.7 30.0 - 36.0 g/dL   RDW 57.8 46.9 - 62.9 %  TSH     Status: None   Collection Time: 10/28/23  9:48 AM  Result Value Ref Range   TSH 0.74 0.35 - 5.50 uIU/mL  B-HCG Quant     Status: None   Collection Time: 10/28/23  9:48 AM  Result Value Ref Range   Quantitative HCG <0.60 mIU/ml    Comment: Non-Pregnant Females >19yr and <78yr=0.0-0.6(mIU/ml)Non-Pregnant Females >76yrs=0.0-3.1(mIU/ml)Non-Pregnant Females Post-Menopause0.1-11.6(mIU/ml)  POCT urine pregnancy     Status: None   Collection Time: 11/25/23  8:51 AM  Result Value Ref Range   Preg Test, Ur Negative Negative        Carnell Christian, MD, MS

## 2024-01-18 NOTE — Patient Instructions (Signed)
  VISIT SUMMARY: Today, you were seen for bilateral shoulder and neck pain that has been ongoing for the past two to three years and has worsened over the last two months. You described the pain as a feeling of heaviness that radiates from your neck down both sides into your arms. You also had a blood pressure check, which was well-controlled.  YOUR PLAN: -TENSION NECK SYNDROME: Tension Neck Syndrome is a condition characterized by muscle tension and pain in the neck and shoulders, often related to posture or repetitive activities. For immediate pain relief, you received a Toradol injection. You have been prescribed celecoxib 200 mg to be taken twice daily with food for pain, and tizanidine 4 mg up to three times daily as needed for muscle relaxation, but be cautious as it may cause drowsiness. A cervical spine X-ray has been ordered to rule out other issues. You should perform home neck and shoulder exercises, use heat and ice as needed, maintain an upright posture, especially at work, and drink plenty of fluids. Please take your medications with food. Follow up in two weeks if your symptoms do not improve.  -HYPERTENSION: Hypertension is high blood pressure. Your blood pressure today was 134/78 mmHg, which indicates that it is well-controlled. Continue with your current management plan.  INSTRUCTIONS: Please schedule a follow-up appointment in two weeks if your symptoms do not improve. Additionally, get a cervical spine X-ray done at the [facility] site as ordered.  For  xray, go to:    [facility] [address] [place], [place], [place] [address] Phone: [phone]

## 2024-01-19 ENCOUNTER — Ambulatory Visit
Admission: RE | Admit: 2024-01-19 | Discharge: 2024-01-19 | Disposition: A | Discharge status: Home / Self Care | Payer: Self-pay | Source: Ambulatory Visit | Attending: Family Medicine | Admitting: Family Medicine

## 2024-01-19 DIAGNOSIS — M542 Cervicalgia: Secondary | ICD-10-CM

## 2024-01-25 ENCOUNTER — Ambulatory Visit: Payer: Self-pay | Admitting: Family Medicine

## 2024-01-26 NOTE — Telephone Encounter (Signed)
 Pt scheduled with Soyla Duverney next week.

## 2024-01-26 NOTE — Telephone Encounter (Signed)
 Copied from CRM [id number]. Topic: Clinical - Lab/Test Results >> Jan 26, 2024 10:11 AM Cruzita Dopp I wrote: Reason for CRM: Patient was calling to discuss results from imaging done on the 6/11. Patient would like a call to discuss

## 2024-02-02 ENCOUNTER — Encounter: Payer: Self-pay | Admitting: Nurse Practitioner

## 2024-02-02 ENCOUNTER — Ambulatory Visit: Payer: Self-pay | Admitting: Nurse Practitioner

## 2024-02-02 VITALS — BP 124/76 | HR 79 | Temp 96.1°F | Ht 60.0 in | Wt 133.2 lb

## 2024-02-02 DIAGNOSIS — I10 Essential (primary) hypertension: Secondary | ICD-10-CM

## 2024-02-02 DIAGNOSIS — G44229 Chronic tension-type headache, not intractable: Secondary | ICD-10-CM

## 2024-02-02 DIAGNOSIS — M542 Cervicalgia: Secondary | ICD-10-CM

## 2024-02-02 DIAGNOSIS — M5030 Other cervical disc degeneration, unspecified cervical region: Secondary | ICD-10-CM

## 2024-02-02 DIAGNOSIS — G8929 Other chronic pain: Secondary | ICD-10-CM

## 2024-02-02 NOTE — Assessment & Plan Note (Addendum)
 Unable to tolerate procardia : tachycardia and fatigue. Switched to losartan  25mg  No adverse effect with losartan  Has maintained low salt diet BP Readings from Last 3 Encounters:  02/02/24 124/76  01/18/24 134/78  11/25/23 128/76    Maintain med dose and DASh diet Advised about need for condoms since she is not longer interested in use of COC F/up in 3months

## 2024-02-02 NOTE — Assessment & Plan Note (Signed)
 Reports improved neck pain and tension headache with use of NSAID and muscle relaxant. Reports intermittent radiculopathy, especially in supine position. Cervical DG:01/2024 Straightening of the cervical spine with minimal degenerative change at C6-C7. Maintain current meds Entered referral to PT

## 2024-02-02 NOTE — Progress Notes (Signed)
 Established Patient Visit  Patient: Natalie Oconnor   DOB: [birth year]   38 y.o. Female  MRN: [id number] Visit Date: 02/02/2024  Subjective:    Chief Complaint  Patient presents with   Neck Pain    Pt states neck pain has improved.    Headache    Pt states her headaches are still present. Same from last visit. Located at frontal and temporal.    HPI HTN (hypertension) Unable to tolerate procardia : tachycardia and fatigue. Switched to losartan  25mg  No adverse effect with losartan  Has maintained low salt diet BP Readings from Last 3 Encounters:  02/02/24 124/76  01/18/24 134/78  11/25/23 128/76    Maintain med dose and DASh diet Advised about need for condoms since she is not longer interested in use of COC F/up in 3months  Chronic neck pain Reports improved neck pain and tension headache with use of NSAID and muscle relaxant. Reports intermittent radiculopathy, especially in supine position. Cervical DG:01/2024 Straightening of the cervical spine with minimal degenerative change at C6-C7. Maintain current meds Entered referral to PT   Reviewed medical, surgical, and social history today  Medications: Outpatient Medications Prior to Visit  Medication Sig   celecoxib  (CELEBREX ) 200 MG capsule Take 1 capsule (200 mg total) by mouth 2 (two) times daily as needed for moderate pain (pain score 4-6).   losartan  (COZAAR ) 25 MG tablet Take 1 tablet (25 mg total) by mouth daily.   tiZANidine  (ZANAFLEX ) 4 MG tablet Take 1 tablet (4 mg total) by mouth every 8 (eight) hours as needed for muscle spasms.   [DISCONTINUED] norethindrone-ethinyl estradiol-FE (JUNEL FE 1/20) 1-20 MG-MCG tablet Take 1 tablet by mouth daily. (Patient not taking: Reported on 02/02/2024)   No facility-administered medications prior to visit.   Reviewed past medical and social history.   ROS per HPI above      Objective:  BP 124/76   Pulse 79   Temp (!) 96.1 F (35.6 C) (Temporal)   Ht 5'  (1.524 m)   Wt 133 lb 3.2 oz (60.4 kg)   LMP 11/18/2023 Comment: Pt states periods have been irregular sinc ebeing off birth control  SpO2 97%   BMI 26.01 kg/m      Physical Exam Vitals and nursing note reviewed.   Cardiovascular:     Rate and Rhythm: Normal rate.     Pulses: Normal pulses.  Pulmonary:     Effort: Pulmonary effort is normal.   Musculoskeletal:        General: Normal range of motion.     Cervical back: Normal range of motion and neck supple. No rigidity or tenderness.  Lymphadenopathy:     Cervical: No cervical adenopathy.   Neurological:     Mental Status: She is alert and oriented to person, place, and time.     No results found for any visits on 02/02/24.    Assessment & Plan:    Problem List Items Addressed This Visit     Chronic neck pain   Reports improved neck pain and tension headache with use of NSAID and muscle relaxant. Reports intermittent radiculopathy, especially in supine position. Cervical DG:01/2024 Straightening of the cervical spine with minimal degenerative change at C6-C7. Maintain current meds Entered referral to PT      Relevant Orders   Ambulatory referral to Physical Therapy   Chronic tension-type headache, not intractable   DDD (degenerative disc disease), cervical  Relevant Orders   Ambulatory referral to Physical Therapy   HTN (hypertension) - Primary   Unable to tolerate procardia : tachycardia and fatigue. Switched to losartan  25mg  No adverse effect with losartan  Has maintained low salt diet BP Readings from Last 3 Encounters:  02/02/24 124/76  01/18/24 134/78  11/25/23 128/76    Maintain med dose and DASh diet Advised about need for condoms since she is not longer interested in use of COC F/up in 3months      Return in about 3 months (around 05/04/2024) for HTN and chronic neck pain.     Roselie Mood, NP

## 2024-02-02 NOTE — Patient Instructions (Signed)
 You will be contacted for physical therapy.  Cervical Strain and Sprain Rehab Ask your health care provider which exercises are safe for you. Do exercises exactly as told by your health care provider and adjust them as directed. It is normal to feel mild stretching, pulling, tightness, or discomfort as you do these exercises. Stop right away if you feel sudden pain or your pain gets worse. Do not begin these exercises until told by your health care provider. Stretching and range-of-motion exercises Cervical side bending  Using good posture, sit on a stable chair or stand up. Without moving your shoulders, slowly tilt your left / right ear to your shoulder until you feel a stretch in the neck muscles on the opposite side. You should be looking straight ahead. Hold for __________ seconds. Repeat with the other side of your neck. Repeat __________ times. Complete this exercise __________ times a day. Cervical rotation  Using good posture, sit on a stable chair or stand up. Slowly turn your head to the side as if you are looking over your left / right shoulder. Keep your eyes level with the ground. Stop when you feel a stretch along the side and the back of your neck. Hold for __________ seconds. Repeat this by turning to your other side. Repeat __________ times. Complete this exercise __________ times a day. Thoracic extension and pectoral stretch  Roll a towel or a small blanket so it is about 4 inches (10 cm) in diameter. Lie down on your back on a firm surface. Put the towel in the middle of your back across your spine. It should not be under your shoulder blades. Put your hands behind your head and let your elbows fall out to your sides. Hold for __________ seconds. Repeat __________ times. Complete this exercise __________ times a day. Strengthening exercises Upper cervical flexion  Lie on your back with a thin pillow behind your head or a small, rolled-up towel under your  neck. Gently tuck your chin toward your chest and nod your head down to look toward your feet. Do not lift your head off the pillow. Hold for __________ seconds. Release the tension slowly. Relax your neck muscles completely before you repeat this exercise. Repeat __________ times. Complete this exercise __________ times a day. Cervical extension  Stand about 6 inches (15 cm) away from a wall, with your back facing the wall. Place a soft object, about 6-8 inches (15-20 cm) in diameter, between the back of your head and the wall. A soft object could be a small pillow, a ball, or a folded towel. Gently tilt your head back and press into the soft object. Keep your jaw and forehead relaxed. Hold for __________ seconds. Release the tension slowly. Relax your neck muscles completely before you repeat this exercise. Repeat __________ times. Complete this exercise __________ times a day. Posture and body mechanics Body mechanics refer to the movements and positions of your body while you do your daily activities. Posture is part of body mechanics. Good posture and healthy body mechanics can help to relieve stress in your body's tissues and joints. Good posture means that your spine is in its natural S-curve position (your spine is neutral), your shoulders are pulled back slightly, and your head is not tipped forward. The following are general guidelines for using improved posture and body mechanics in your everyday activities. Sitting  When sitting, keep your spine neutral and keep your feet flat on the floor. Use a footrest, if needed, and keep  your thighs parallel to the floor. Avoid rounding your shoulders. Avoid tilting your head forward. When working at a desk or a computer, keep your desk at a height where your hands are slightly lower than your elbows. Slide your chair under your desk so you are close enough to maintain good posture. When working at a computer, place your monitor at a height where  you are looking straight ahead and you do not have to tilt your head forward or downward to look at the screen. Standing  When standing, keep your spine neutral and keep your feet about hip-width apart. Keep a slight bend in your knees. Your ears, shoulders, and hips should line up. When you do a task in which you stand in one place for a long time, place one foot up on a stable object that is 2-4 inches (5-10 cm) high, such as a footstool. This helps keep your spine neutral. Resting When lying down and resting, avoid positions that are most painful for you. Try to support your neck in a neutral position. You can use a contour pillow or a small rolled-up towel. Your pillow should support your neck but not push on it. This information is not intended to replace advice given to you by your health care provider. Make sure you discuss any questions you have with your health care provider. Document Revised: 11/30/2022 Document Reviewed: 02/16/2022 Elsevier Patient Education  2024 [employer].

## 2024-03-01 ENCOUNTER — Other Ambulatory Visit: Payer: Self-pay

## 2024-03-01 ENCOUNTER — Encounter: Payer: Self-pay | Admitting: Physical Therapy

## 2024-03-01 ENCOUNTER — Ambulatory Visit: Payer: Self-pay | Attending: Nurse Practitioner | Admitting: Physical Therapy

## 2024-03-01 DIAGNOSIS — M5030 Other cervical disc degeneration, unspecified cervical region: Secondary | ICD-10-CM | POA: Insufficient documentation

## 2024-03-01 DIAGNOSIS — R293 Abnormal posture: Secondary | ICD-10-CM | POA: Insufficient documentation

## 2024-03-01 DIAGNOSIS — M542 Cervicalgia: Secondary | ICD-10-CM | POA: Insufficient documentation

## 2024-03-01 DIAGNOSIS — G8929 Other chronic pain: Secondary | ICD-10-CM | POA: Insufficient documentation

## 2024-03-01 DIAGNOSIS — M6281 Muscle weakness (generalized): Secondary | ICD-10-CM | POA: Insufficient documentation

## 2024-03-01 NOTE — Therapy (Signed)
 OUTPATIENT PHYSICAL THERAPY CERVICAL EVALUATION   Patient Name: Natalie Oconnor MRN: [id number] DOB:04/14/1986, 38 y.o., female Today's Date: 03/01/2024  END OF SESSION:  PT End of Session - 03/01/24 0808     Visit Number 1    Number of Visits 1    Authorization Type self pay    Authorization Time Period 03/01/24 to 03/01/24    PT Start Time 0807   late pt arrival   PT Stop Time 0845    PT Time Calculation (min) 38 min    Activity Tolerance Patient tolerated treatment well    Behavior During Therapy [facility] for tasks assessed/performed          Past Medical History:  Diagnosis Date   Asthma    Late prenatal care    No pertinent past medical history    Normal pregnancy 05/08/2011   SVD (spontaneous vaginal delivery) 05/08/2011   Past Surgical History:  Procedure Laterality Date   NO PAST SURGERIES     Patient Active Problem List   Diagnosis Date Noted   Chronic tension-type headache, not intractable 02/02/2024   DDD (degenerative disc disease), cervical 02/02/2024   Chronic neck pain 02/02/2024   HTN (hypertension) 10/28/2023   History of asthma 04/25/2018    PCP: Katheen Roselie Rockford, NP  REFERRING PROVIDER: Katheen Roselie Rockford, NP  REFERRING DIAG:  Diagnosis  M50.30 (ICD-10-CM) - DDD (degenerative disc disease), cervical  M54.2,G89.29 (ICD-10-CM) - Chronic neck pain    THERAPY DIAG:  Cervicalgia  Abnormal posture  Muscle weakness (generalized)  Rationale for Evaluation and Treatment: Rehabilitation  ONSET DATE: at least 2 months ago  SUBJECTIVE:                                                                                                                                                                                                         SUBJECTIVE STATEMENT:  Feels like I get HAs from my neck, when I sleep I get numbness in both hands (mostly when sidelying). Shoulders are tight and sore. No known MOI, no car accidents or falls.   Hand dominance:  Right  PERTINENT HISTORY:  See above   PAIN:  Are you having pain? Yes: NPRS scale: 6-7/10 Pain location: center of neck (posteriorly) Pain description: tired, fatigued, tight  Aggravating factors: laying on side Relieving factors: unsure   PRECAUTIONS: None  RED FLAGS: None     WEIGHT BEARING RESTRICTIONS: No  FALLS:  Has patient fallen in last 6 months? No  LIVING ENVIRONMENT: Lives with: lives with their family Lives in: House/apartment  OCCUPATION: nail tech   PLOF: Independent, Independent with basic ADLs, Independent with gait, and Independent with transfers  PATIENT GOALS: address neck pain, get rid of HAs   NEXT MD VISIT: PCP 05/04/24  OBJECTIVE:  Note: Objective measures were completed at Evaluation unless otherwise noted.  DIAGNOSTIC FINDINGS:   Narrative & Impression CLINICAL DATA:  Shoulder pain stiffness   EXAM: CERVICAL SPINE COMPLETE WITH FLEXION AND EXTENSION VIEWS   COMPARISON:  None Available.   FINDINGS: Straightening of the cervical spine. Vertebral body heights are maintained. Minimal degenerative change at C6-C7. No suspicious change in alignment with flexion or extension. The dens and lateral masses are normal. Probable patent foramina allowing for positioning.   IMPRESSION: Straightening of the cervical spine with minimal degenerative change at C6-C7.    COGNITION: Overall cognitive status: Within functional limits for tasks assessed  SENSATION: [facility] Not tested- reports no numbness/tingling unless she is trying to sleep on her side   POSTURE: rounded shoulders and forward head  PALPATION: Very tight and sore in B thoracic and cervical paraspinals, upper traps and suboccipitals    CERVICAL ROM:   Active ROM A/PROM (deg) eval  Flexion WNL but sore   Extension WNL but sore   Right lateral flexion 50% limited   Left lateral flexion 50% limited   Right rotation WNL   Left rotation WNL    (Blank rows = not  tested)  UPPER EXTREMITY ROM:  Active ROM Right eval Left eval  Shoulder flexion WNL  WNL   Shoulder extension    Shoulder abduction WNL soreness  WNL soreness   Shoulder adduction    Shoulder extension    Shoulder internal rotation WNL  WNL   Shoulder external rotation WNL  WNL   Elbow flexion    Elbow extension    Wrist flexion    Wrist extension    Wrist ulnar deviation    Wrist radial deviation    Wrist pronation    Wrist supination     (Blank rows = not tested)  UPPER EXTREMITY MMT:  MMT Right eval Left eval  Shoulder flexion 4 4  Shoulder extension    Shoulder abduction 4 4  Shoulder adduction    Shoulder extension    Shoulder internal rotation 4 4  Shoulder external rotation 4- 4-  Middle trapezius    Lower trapezius    Elbow flexion    Elbow extension    Wrist flexion    Wrist extension    Wrist ulnar deviation    Wrist radial deviation    Wrist pronation    Wrist supination    Grip strength     (Blank rows = not tested)    TREATMENT DATE:   03/01/24  Eval, POC, HEP  practice and education as below               UBE L2 x4 min forward/4 min backward  PATIENT EDUCATION:  Education details: exam findings, POC, HEP- focus on HEP today due to her request for one time visit  Person educated: Patient Education method: Explanation, Demonstration, and Handouts Education comprehension: verbalized understanding, returned demonstration, and needs further education  HOME EXERCISE PROGRAM: Access Code: [email or web address] URL: [email or web address] Date: 03/01/2024 Prepared by: Josette Rough  Exercises - Seated Thoracic Extension with Hands Behind Neck  - 2 x daily - 7 x weekly - 1 sets - 10 reps - Seated Scapular Retraction  - 2 x daily - 7 x weekly - 1 sets - 10 reps - 3 seconds  hold - Seated Backward Shoulder Rolls  - 2 x daily  - 7 x weekly - 1 sets - 20 reps - Seated Upper Trapezius Stretch  - 2 x daily - 7 x weekly - 1 sets - 6 reps - 30 seconds  hold - Scapular Retraction with Resistance  - 2 x daily - 7 x weekly - 1 sets - 10 reps - 2 seconds hold - Shoulder extension with resistance - Neutral  - 2 x daily - 7 x weekly - 1 sets - 10 reps - 2 seconds  hold  ASSESSMENT:  CLINICAL IMPRESSION: Patient is a 38 y.o. F who was seen today for physical therapy evaluation and treatment for  Diagnosis  M50.30 (ICD-10-CM) - DDD (degenerative disc disease), cervical  M54.2,G89.29 (ICD-10-CM) - Chronic neck pain  . Objectives as above- she requested 1x visit due to financial concerns being self-pay. Focused majority of session on exercise and HEP development.   OBJECTIVE IMPAIRMENTS: decreased ROM, decreased strength, increased fascial restrictions, increased muscle spasms, improper body mechanics, and postural dysfunction.   ACTIVITY LIMITATIONS: carrying, lifting, and caring for others  PARTICIPATION LIMITATIONS: meal prep, cleaning, laundry, driving, shopping, community activity, and occupation  PERSONAL FACTORS: Age, Fitness, Past/current experiences, and Time since onset of injury/illness/exacerbation are also affecting patient's functional outcome.   REHAB POTENTIAL: Good  CLINICAL DECISION MAKING: Stable/uncomplicated  EVALUATION COMPLEXITY: Low   GOALS: Goals reviewed with patient? No  SHORT TERM GOALS: Target date: 03/01/24   All questions/concerns to have answered/addressed to her satisfaction Baseline:  Goal status: INITIAL  2.  Will be compliant with appropriate HEP for self-management of her condition  Baseline:  Goal status: INITIAL      LONG TERM GOALS: Target date: N/A        PLAN:  PT FREQUENCY: one time visit  PT DURATION: 1 sessions  PLANNED INTERVENTIONS: 97110-Therapeutic exercises, 97530- Therapeutic activity, and 02464- Self Care  PLAN FOR NEXT SESSION: one time  visit due to financial limitations, thank you for the referral!    Josette Rough, PT, DPT 03/01/24 8:53 AM

## 2024-03-24 ENCOUNTER — Ambulatory Visit: Payer: Self-pay | Admitting: *Deleted

## 2024-03-24 NOTE — Telephone Encounter (Signed)
 FYI Only or Action Required?: FYI only for provider.  Patient was last seen in primary care on 02/02/2024 by Nche, Roselie Rockford, NP.  Called Nurse Triage reporting Neck Pain (Neck/shoulder pain).  Symptoms began several months ago.  Interventions attempted: Nothing.  Symptoms are: unchanged.  Triage Disposition: See Physician Within 24 Hours  Patient/caregiver understands and will follow disposition?: yes   Reason for Disposition  Numbness in an arm or hand (i.e., loss of sensation)  Answer Assessment - Initial Assessment Questions 1. ONSET: When did the pain begin? (e.g., minutes, hours, days)     Chronic pain- 1 month 2. LOCATION: Where does it hurt? (upper, mid or lower back)     Upper- tight shoulder and neck- bilateral 3. SEVERITY: How bad is the pain?  (e.g., Scale 1-10; mild, moderate, or severe)     Mild- tight 4. PATTERN: Is the pain constant? (e.g., yes, no; constant, intermittent)      constant 5. RADIATION: Does the pain shoot into your legs or somewhere else?     Neck, shoulder- not back  Answer Assessment - Initial Assessment Questions 1. ONSET: When did the pain begin?      1 month 2. LOCATION: Where does it hurt?      R shoulder/neck 3. PATTERN Does the pain come and go, or has it been constant since it started?      constant 4. SEVERITY: How bad is the pain?  (Scale 0-10; or none or slight stiffness, mild, moderate, severe)     Mild- stiffness 5. RADIATION: Does the pain go anywhere else, shoot into your arms?     Shoulder/neck 6. CORD SYMPTOMS: Any weakness or numbness of the arms or legs?     Arms- feel numb 7. CAUSE: What do you think is causing the neck pain?     Work/home 8. NECK OVERUSE: Any recent activities that involved turning or twisting the neck?     unsure 9. OTHER SYMPTOMS: Do you have any other symptoms? (e.g., headache, fever, chest pain, difficulty breathing, neck swelling)     Headache- a lot, fast  heartrate  Protocols used: Back Pain-A-AH, Neck Pain or Stiffness-A-AH   Copied from CRM #[id number]. Topic: Clinical - Red Word Triage >> Mar 24, 2024 10:38 AM Ismael A wrote: Red Word that prompted transfer to Nurse Triage: patient calling in stating she is having worsening pain in back, stated she has seen PCP for this before and was given a shot for the pain

## 2024-03-27 ENCOUNTER — Ambulatory Visit (INDEPENDENT_AMBULATORY_CARE_PROVIDER_SITE_OTHER): Payer: Self-pay | Admitting: Nurse Practitioner

## 2024-03-27 ENCOUNTER — Encounter: Payer: Self-pay | Admitting: Nurse Practitioner

## 2024-03-27 VITALS — BP 150/90 | HR 70 | Temp 98.5°F | Ht 60.0 in | Wt 132.8 lb

## 2024-03-27 DIAGNOSIS — I10 Essential (primary) hypertension: Secondary | ICD-10-CM

## 2024-03-27 DIAGNOSIS — G8929 Other chronic pain: Secondary | ICD-10-CM

## 2024-03-27 DIAGNOSIS — M5030 Other cervical disc degeneration, unspecified cervical region: Secondary | ICD-10-CM

## 2024-03-27 MED ORDER — METHOCARBAMOL 750 MG PO TABS: 750.0000 mg | ORAL_TABLET | Freq: Every day | ORAL | 0 refills | Status: AC

## 2024-03-27 MED ORDER — LOSARTAN POTASSIUM 50 MG PO TABS: 50.0000 mg | ORAL_TABLET | Freq: Every day | ORAL | 1 refills | Status: AC

## 2024-03-27 MED ORDER — NAPROXEN 500 MG PO TABS: 500.0000 mg | ORAL_TABLET | Freq: Two times a day (BID) | ORAL | 0 refills | Status: AC

## 2024-03-27 NOTE — Assessment & Plan Note (Signed)
 BP not at goal with losartan  25mg  Reports she is compliant with med dose BP Readings from Last 3 Encounters:  03/27/24 (!) 144/80  02/02/24 124/76  01/18/24 134/78    Maintain DASh diet, increase losartan  dose to 50mg  Advised about need for condoms since she is not longer interested in use of COC F/up in 2weeks

## 2024-03-27 NOTE — Patient Instructions (Signed)
 Stop celebrex  and tinazidine Start naproxen  500mg  and tylenol  500mg  2x/day with food Start robaxin  at bedtime only Complete neck and shoulder exercise daily Alternate between warm and cold compress as needed Increase losartan  dose to 50mg  daily. Maintain a low salt diet

## 2024-03-27 NOTE — Assessment & Plan Note (Signed)
 Chronic, waxing and waning, radiates to left arm, no weakness or paresthesia. No improvement with celebrex  200mg  daily and zanaflex  at hs. Reports daytime somnolence with zanaflex  Some improvement with toradol  IM and tylenol . Reports worsening pain with 1session of PT. She declined to resume PT. DG cervical spine 01/2024: Straightening of the cervical spine with minimal degenerative change at C6-C7.  Advised to perform home exercises daily, alternate between warm and cold compress prn, switch to naproxen  500mg  BID and robaxin  750mg  at hs. F/up in 2weeks

## 2024-03-27 NOTE — Progress Notes (Signed)
 Established Patient Visit  Patient: Natalie Oconnor   DOB: Jul 24, 1986   38 y.o. Female  MRN: [id number] Visit Date: 03/27/2024  Subjective:    Chief Complaint  Patient presents with   Pain    Sharp pain in neck and between shoulders intermittently radiates to left side of back for 1 week     HPI Chronic neck pain Chronic, waxing and waning, radiates to left arm, no weakness or paresthesia. No improvement with celebrex  200mg  daily and zanaflex  at hs. Reports daytime somnolence with zanaflex  Some improvement with toradol  IM and tylenol . Reports worsening pain with 1session of PT. She declined to resume PT. DG cervical spine 01/2024: Straightening of the cervical spine with minimal degenerative change at C6-C7.  Advised to perform home exercises daily, alternate between warm and cold compress prn, switch to naproxen  500mg  BID and robaxin  750mg  at hs. F/up in 2weeks  HTN (hypertension) BP not at goal with losartan  25mg  Reports she is compliant with med dose BP Readings from Last 3 Encounters:  03/27/24 (!) 144/80  02/02/24 124/76  01/18/24 134/78    Maintain DASh diet, increase losartan  dose to 50mg  Advised about need for condoms since she is not longer interested in use of COC F/up in 2weeks  Reviewed medical, surgical, and social history today  Medications: Outpatient Medications Prior to Visit  Medication Sig   [DISCONTINUED] celecoxib  (CELEBREX ) 200 MG capsule Take 1 capsule (200 mg total) by mouth 2 (two) times daily as needed for moderate pain (pain score 4-6).   [DISCONTINUED] losartan  (COZAAR ) 25 MG tablet Take 1 tablet (25 mg total) by mouth daily.   [DISCONTINUED] tiZANidine  (ZANAFLEX ) 4 MG tablet Take 1 tablet (4 mg total) by mouth every 8 (eight) hours as needed for muscle spasms.   No facility-administered medications prior to visit.   Reviewed past medical and social history.   ROS per HPI above      Objective:  BP (!) 150/90 (BP Location: Left  Arm, Patient Position: Supine, Cuff Size: Normal)   Pulse 70   Temp 98.5 F (36.9 C) (Oral)   Ht 5' (1.524 m)   Wt 132 lb 12.8 oz (60.2 kg)   LMP 03/14/2024 (Exact Date)   SpO2 98%   BMI 25.94 kg/m      Physical Exam  No results found for any visits on 03/27/24.    Assessment & Plan:    Problem List Items Addressed This Visit     Chronic neck pain   Chronic, waxing and waning, radiates to left arm, no weakness or paresthesia. No improvement with celebrex  200mg  daily and zanaflex  at hs. Reports daytime somnolence with zanaflex  Some improvement with toradol  IM and tylenol . Reports worsening pain with 1session of PT. She declined to resume PT. DG cervical spine 01/2024: Straightening of the cervical spine with minimal degenerative change at C6-C7.  Advised to perform home exercises daily, alternate between warm and cold compress prn, switch to naproxen  500mg  BID and robaxin  750mg  at hs. F/up in 2weeks      Relevant Medications   naproxen  (NAPROSYN ) 500 MG tablet   methocarbamol  (ROBAXIN ) 750 MG tablet   DDD (degenerative disc disease), cervical - Primary   Relevant Medications   naproxen  (NAPROSYN ) 500 MG tablet   methocarbamol  (ROBAXIN ) 750 MG tablet   HTN (hypertension)   BP not at goal with losartan  25mg  Reports she is compliant with med dose BP Readings from  Last 3 Encounters:  03/27/24 (!) 144/80  02/02/24 124/76  01/18/24 134/78    Maintain DASh diet, increase losartan  dose to 50mg  Advised about need for condoms since she is not longer interested in use of COC F/up in 2weeks      Relevant Medications   losartan  (COZAAR ) 50 MG tablet   Return in about 2 weeks (around 04/10/2024) for HTN and neck pain.     Roselie Mood, NP

## 2024-05-04 ENCOUNTER — Ambulatory Visit: Payer: Self-pay | Admitting: Nurse Practitioner

## 2024-05-16 ENCOUNTER — Ambulatory Visit: Payer: Self-pay | Admitting: Nurse Practitioner

## 2024-06-06 ENCOUNTER — Encounter: Payer: Self-pay | Admitting: Nurse Practitioner
